# Patient Record
Sex: Female | Born: 1975 | Race: Black or African American | Hispanic: No | Smoking: Never smoker
Health system: Southern US, Community
[De-identification: ages and names within clinical notes are randomized; demographics above are authoritative.]

## PROBLEM LIST (undated history)

## (undated) DIAGNOSIS — K222 Esophageal obstruction: Secondary | ICD-10-CM

## (undated) DIAGNOSIS — E119 Type 2 diabetes mellitus without complications: Secondary | ICD-10-CM

## (undated) DIAGNOSIS — M1990 Unspecified osteoarthritis, unspecified site: Secondary | ICD-10-CM

## (undated) DIAGNOSIS — K219 Gastro-esophageal reflux disease without esophagitis: Secondary | ICD-10-CM

## (undated) DIAGNOSIS — I10 Essential (primary) hypertension: Secondary | ICD-10-CM

## (undated) DIAGNOSIS — N879 Dysplasia of cervix uteri, unspecified: Secondary | ICD-10-CM

## (undated) DIAGNOSIS — J45909 Unspecified asthma, uncomplicated: Secondary | ICD-10-CM

## (undated) HISTORY — DX: Unspecified osteoarthritis, unspecified site: M19.90

## (undated) HISTORY — PX: REDUCTION MAMMAPLASTY: SUR839

## (undated) HISTORY — PX: ESOPHAGEAL DILATION: SHX303

## (undated) HISTORY — DX: Dysplasia of cervix uteri, unspecified: N87.9

## (undated) HISTORY — DX: Gastro-esophageal reflux disease without esophagitis: K21.9

## (undated) HISTORY — DX: Type 2 diabetes mellitus without complications: E11.9

## (undated) HISTORY — DX: Essential (primary) hypertension: I10

## (undated) HISTORY — DX: Esophageal obstruction: K22.2

---

## 1998-02-16 HISTORY — PX: BREAST REDUCTION SURGERY: SHX8

## 2002-02-16 HISTORY — PX: LEEP: SHX91

## 2005-02-16 HISTORY — PX: TUBAL LIGATION: SHX77

## 2016-09-17 DIAGNOSIS — K222 Esophageal obstruction: Secondary | ICD-10-CM | POA: Insufficient documentation

## 2016-11-05 DIAGNOSIS — K200 Eosinophilic esophagitis: Secondary | ICD-10-CM | POA: Insufficient documentation

## 2020-04-02 ENCOUNTER — Ambulatory Visit
Admission: EM | Admit: 2020-04-02 | Discharge: 2020-04-02 | Disposition: A | Discharge status: Home / Self Care | Payer: BC Managed Care – PPO | Attending: Family Medicine | Admitting: Family Medicine

## 2020-04-02 ENCOUNTER — Encounter: Payer: Self-pay | Admitting: Emergency Medicine

## 2020-04-02 ENCOUNTER — Other Ambulatory Visit: Payer: Self-pay

## 2020-04-02 DIAGNOSIS — M545 Low back pain, unspecified: Secondary | ICD-10-CM | POA: Diagnosis present

## 2020-04-02 DIAGNOSIS — Z1152 Encounter for screening for COVID-19: Secondary | ICD-10-CM | POA: Diagnosis present

## 2020-04-02 DIAGNOSIS — R509 Fever, unspecified: Secondary | ICD-10-CM | POA: Insufficient documentation

## 2020-04-02 HISTORY — DX: Unspecified asthma, uncomplicated: J45.909

## 2020-04-02 LAB — POCT URINALYSIS DIP (MANUAL ENTRY)
Bilirubin, UA: NEGATIVE
Glucose, UA: NEGATIVE mg/dL
Nitrite, UA: NEGATIVE
Protein Ur, POC: NEGATIVE mg/dL
Spec Grav, UA: 1.015 (ref 1.010–1.025)
Urobilinogen, UA: 0.2 E.U./dL
pH, UA: 5.5 (ref 5.0–8.0)

## 2020-04-02 MED ORDER — CEFTRIAXONE SODIUM 500 MG IJ SOLR
500.0000 mg | Freq: Once | INTRAMUSCULAR | Status: AC
  Administered 2020-04-02: 500 mg via INTRAMUSCULAR

## 2020-04-02 MED ORDER — ACETAMINOPHEN 325 MG PO TABS
975.0000 mg | ORAL_TABLET | Freq: Once | ORAL | Status: AC
  Administered 2020-04-02: 975 mg via ORAL

## 2020-04-02 MED ORDER — SULFAMETHOXAZOLE-TRIMETHOPRIM 800-160 MG PO TABS: 1.0000 | ORAL_TABLET | Freq: Two times a day (BID) | ORAL | 0 refills | Status: DC

## 2020-04-02 MED ORDER — ONDANSETRON 4 MG PO TBDP: 4.0000 mg | ORAL_TABLET | Freq: Three times a day (TID) | ORAL | 0 refills | Status: DC | PRN

## 2020-04-02 NOTE — Discharge Instructions (Addendum)
I believe that you may have a kidney infection I have given you some antibiotics here Sending more to the pharmacy.  You can take tylenol for pain and fever.  Zofran for nausea as needed.  Push fluids.  Covid an flu test pending.  Follow up as needed for continued or worsening symptoms

## 2020-04-02 NOTE — ED Triage Notes (Signed)
Pt presents today with fever and body aches x 2 days. No meds pta.

## 2020-04-02 NOTE — ED Provider Notes (Signed)
Kellie Cowan    CSN: [id number] Arrival date & time: 04/02/20  1111      History   Chief Complaint Chief Complaint  Patient presents with  . Fever  . Generalized Body Aches    HPI Kellie Cowan is a 45 y.o. female.   Pt is a 45 year old female that presents with lower/mid back pain, fever, body aches. This started 2 days ago and worsened. Fever 103.1 here today. No antipyretics today. No cough, chest congestion, nasal congestion, rhinorrhea, sore throat, ear pain, vomiting or diarrhea. Mild nausea. No urinary symptoms. No recent sick contacts.      Past Medical History:  Diagnosis Date  . Asthma     There are no problems to display for this patient.   History reviewed. No pertinent surgical history.  OB History   No obstetric history on file.      Home Medications    Prior to Admission medications   Medication Sig Start Date End Date Taking? Authorizing Provider  ondansetron (ZOFRAN ODT) 4 MG disintegrating tablet Take 1 tablet (4 mg total) by mouth every 8 (eight) hours as needed for nausea or vomiting. 04/02/20  Yes Timouthy Gilardi A, NP  sulfamethoxazole-trimethoprim (BACTRIM DS) 800-160 MG tablet Take 1 tablet by mouth 2 (two) times daily for 7 days. 04/02/20 04/09/20 Yes Terin Cragle A, NP  betamethasone valerate (VALISONE) 0.1 % cream Apply topically. 03/25/20   [provider]    Family History History reviewed. No pertinent family history.  Social History Social History   Tobacco Use  . Smoking status: Never Smoker  . Smokeless tobacco: Never Used  Substance Use Topics  . Alcohol use: Not Currently    Comment: occ  . Drug use: Never     Allergies   Patient has no known allergies.   Review of Systems Review of Systems   Physical Exam Triage Vital Signs ED Triage Vitals  Enc Vitals Group     BP 04/02/20 1124 120/73     Pulse Rate 04/02/20 1124 (!) 118     Resp 04/02/20 1124 18     Temp 04/02/20 1124 (!) 103.1 F (39.5 C)      Temp Source 04/02/20 1124 Oral     SpO2 04/02/20 1124 98 %     Weight 04/02/20 1127 160 lb (72.6 kg)     Height 04/02/20 1127 5\' 2"  (1.575 m)     Head Circumference --      Peak Flow --      Pain Score 04/02/20 1126 4     Pain Loc --      Pain Edu? --      Excl. in [place]? --    No data found.  Updated Vital Signs BP 120/73 (BP Location: Left Arm)   Pulse (!) 118   Temp (!) 103.1 F (39.5 C) (Oral)   Resp 18   Ht 5\' 2"  (1.575 m)   Wt 160 lb (72.6 kg)   LMP 03/22/2020   SpO2 98%   BMI 29.26 kg/m   Visual Acuity Right Eye Distance:   Left Eye Distance:   Bilateral Distance:    Right Eye Near:   Left Eye Near:    Bilateral Near:     Physical Exam Vitals and nursing note reviewed.  Constitutional:      General: She is not in acute distress.    Appearance: Normal appearance. She is not ill-appearing, toxic-appearing or diaphoretic.  HENT:  Head: Normocephalic.  Eyes:     Conjunctiva/sclera: Conjunctivae normal.  Pulmonary:     Effort: Pulmonary effort is normal.  Musculoskeletal:        General: Normal range of motion.     Cervical back: Normal range of motion.       Back:     Comments: Area of pain No CVA tenderness   Skin:    General: Skin is warm and dry.     Findings: No rash.  Neurological:     Mental Status: She is alert.  Psychiatric:        Mood and Affect: Mood normal.      UC Treatments / Results  Labs (all labs ordered are listed, but only abnormal results are displayed) Labs Reviewed  POCT URINALYSIS DIP (MANUAL ENTRY) - Abnormal; Notable for the following components:      Result Value   Ketones, POC UA trace (5) (*)    Blood, UA large (*)    Leukocytes, UA Small (1+) (*)    All other components within normal limits  COVID-19, FLU A+B NAA  URINE CULTURE    EKG   Radiology No results found.  Procedures Procedures (including critical care time)  Medications Ordered in UC Medications  acetaminophen (TYLENOL) tablet 975  mg (975 mg Oral Given 04/02/20 1137)  cefTRIAXone (ROCEPHIN) injection 500 mg (500 mg Intramuscular Given 04/02/20 1200)    Initial Impression / Assessment and Plan / UC Course  I have reviewed the triage vital signs and the nursing notes.  Pertinent labs & imaging results that were available during my care of the patient were reviewed by me and considered in my medical decision making (see chart for details).     Back pain, fever.  Most of a diagnosis based on symptoms, fever and urinalysis report is complicated urinary tract infection. No other signs and symptoms to point towards Covid versus flu Urine with small leuks, large blood.  Sending for culture.  We will go ahead and treat for possible urinary tract/kidney infection Treating with Rocephin injection here in clinic.  Bactrim sent to the pharmacy to start taking today.  Recommend push fluids. Zofran for nausea as needed Follow up as needed for continued or worsening symptoms  Final Clinical Impressions(s) / UC Diagnoses   Final diagnoses:  Encounter for screening for COVID-19  Fever, unspecified  Acute low back pain without sciatica, unspecified back pain laterality     Discharge Instructions     I believe that you may have a kidney infection I have given you some antibiotics here Sending more to the pharmacy.  You can take tylenol for pain and fever.  Zofran for nausea as needed.  Push fluids.  Covid an flu test pending.  Follow up as needed for continued or worsening symptoms       ED Prescriptions    Medication Sig Dispense Auth. Provider   sulfamethoxazole-trimethoprim (BACTRIM DS) 800-160 MG tablet Take 1 tablet by mouth 2 (two) times daily for 7 days. 14 tablet Keian Odriscoll A, NP   ondansetron (ZOFRAN ODT) 4 MG disintegrating tablet Take 1 tablet (4 mg total) by mouth every 8 (eight) hours as needed for nausea or vomiting. 20 tablet Loura Halt A, NP     PDMP not reviewed this encounter.   Loura Halt A,  NP 04/02/20 1230

## 2020-04-03 LAB — URINE CULTURE: Culture: 10000 — AB

## 2020-04-03 LAB — COVID-19, FLU A+B NAA
Influenza A, NAA: NOT DETECTED
Influenza B, NAA: NOT DETECTED
SARS-CoV-2, NAA: NOT DETECTED

## 2020-04-04 ENCOUNTER — Encounter: Payer: Self-pay | Admitting: Family Medicine

## 2020-04-04 ENCOUNTER — Ambulatory Visit
Admission: EM | Admit: 2020-04-04 | Discharge: 2020-04-04 | Disposition: A | Discharge status: Home / Self Care | Payer: BC Managed Care – PPO

## 2020-04-04 ENCOUNTER — Other Ambulatory Visit: Payer: Self-pay

## 2020-04-04 DIAGNOSIS — R21 Rash and other nonspecific skin eruption: Secondary | ICD-10-CM

## 2020-04-04 NOTE — ED Provider Notes (Signed)
[name]    CSN: [id number] Arrival date & time: 04/04/20  1432      History   Chief Complaint Chief Complaint  Patient presents with  . Rash    HPI Kellie Cowan is a 45 y.o. female.   Kellie Cowan is a 45 year old female here today for rash.  Patient started bactrim on Tuesday for UTI and developed rash Tuesday evening on right cheek.  Patient reports that rash spread to left side of face on Wednesday and hands began to itch.  Rash has not gotten worse or spread since yesterday.  Patient denies using anything to relieve itching  The history is provided by the patient.  Rash Location:  Face Facial rash location:  L cheek and R cheek Quality: itchiness   Severity:  Mild Onset quality:  Gradual Duration:  2 days Timing:  Constant Progression:  Improving Chronicity:  New Context: medications   Relieved by:  None tried Worsened by:  Nothing Ineffective treatments:  None tried Associated symptoms: no fatigue, no fever, no hoarse voice, no periorbital edema, no shortness of breath, no throat swelling and no tongue swelling     Past Medical History:  Diagnosis Date  . Asthma     There are no problems to display for this patient.   History reviewed. No pertinent surgical history.  OB History   No obstetric history on file.      Home Medications    Prior to Admission medications   Medication Sig Start Date End Date Taking? Authorizing Provider  betamethasone valerate (VALISONE) 0.1 % cream Apply topically. 03/25/20   [provider]  ondansetron (ZOFRAN ODT) 4 MG disintegrating tablet Take 1 tablet (4 mg total) by mouth every 8 (eight) hours as needed for nausea or vomiting. 04/02/20   Orvan July, NP    Family History History reviewed. No pertinent family history.  Social History Social History   Tobacco Use  . Smoking status: Never Smoker  . Smokeless tobacco: Never Used  Substance Use Topics  . Alcohol use: Not Currently     Comment: occ  . Drug use: Never     Allergies   Patient has no known allergies.   Review of Systems Review of Systems  Constitutional: Negative for fatigue and fever.  HENT: Negative for hoarse voice.   Respiratory: Negative for shortness of breath.   Skin: Positive for rash.     Physical Exam Triage Vital Signs ED Triage Vitals  Enc Vitals Group     BP 04/04/20 1441 128/84     Pulse Rate 04/04/20 1441 90     Resp 04/04/20 1441 18     Temp 04/04/20 1441 98.8 F (37.1 C)     Temp Source 04/04/20 1441 Oral     SpO2 04/04/20 1441 98 %     Weight --      Height --      Head Circumference --      Peak Flow --      Pain Score 04/04/20 1445 0     Pain Loc --      Pain Edu? --      Excl. in [place]? --    No data found.  Updated Vital Signs BP 128/84 (BP Location: Left Arm)   Pulse 90   Temp 98.8 F (37.1 C) (Oral)   Resp 18   LMP 03/22/2020   SpO2 98%   Visual Acuity Right Eye Distance:   Left Eye Distance:  Bilateral Distance:    Right Eye Near:   Left Eye Near:    Bilateral Near:     Physical Exam Vitals and nursing note reviewed.  Constitutional:      General: She is not in acute distress.    Appearance: She is not ill-appearing or diaphoretic.  HENT:     Head: Normocephalic and atraumatic.  Pulmonary:     Effort: Pulmonary effort is normal.  Skin:    General: Skin is warm and dry.     Findings: Rash present. Rash is papular.  Neurological:     Mental Status: She is alert.      UC Treatments / Results  Labs (all labs ordered are listed, but only abnormal results are displayed) Labs Reviewed - No data to display  EKG   Radiology No results found.  Procedures Procedures (including critical care time)  Medications Ordered in UC Medications - No data to display  Initial Impression / Assessment and Plan / UC Course  I have reviewed the triage vital signs and the nursing notes.  Pertinent labs & imaging results that were available  during my care of the patient were reviewed by me and considered in my medical decision making (see chart for details).     Rash, likely related to antibiotic use.  Assessment negative for any additional red flags.   Bactrim discontinued. May take benadryl for itching.  Instructed to monitor rash for spreading and told to follow up as needed.   Final Clinical Impressions(s) / UC Diagnoses   Final diagnoses:  Rash and nonspecific skin eruption     Discharge Instructions     Stop taking bactrim.  Rash is most likely related to bactrim use.  You may use benadryl for itching.    Follow up if rash worsens, spreads, or does not improve over the next few days.     ED Prescriptions    None     PDMP not reviewed this encounter.   Pearson Forster, NP 04/04/20 [phone]

## 2020-04-04 NOTE — ED Triage Notes (Signed)
Pt presents today with rash to face and hands. She does report the rash began after taking first dose of antibiotic.

## 2020-04-04 NOTE — Discharge Instructions (Addendum)
Stop taking bactrim.  Rash is most likely related to bactrim use.  You may use benadryl for itching.    Follow up if rash worsens, spreads, or does not improve over the next few days.

## 2021-05-15 ENCOUNTER — Other Ambulatory Visit (HOSPITAL_COMMUNITY)
Admission: RE | Admit: 2021-05-15 | Discharge: 2021-05-15 | Disposition: A | Discharge status: Home / Self Care | Payer: No Typology Code available for payment source | Source: Ambulatory Visit | Attending: Obstetrics & Gynecology | Admitting: Obstetrics & Gynecology

## 2021-05-15 ENCOUNTER — Encounter: Payer: Self-pay | Admitting: Obstetrics & Gynecology

## 2021-05-15 ENCOUNTER — Ambulatory Visit (INDEPENDENT_AMBULATORY_CARE_PROVIDER_SITE_OTHER): Payer: No Typology Code available for payment source | Admitting: Obstetrics & Gynecology

## 2021-05-15 VITALS — BP 143/80 | HR 82 | Wt 162.0 lb

## 2021-05-15 DIAGNOSIS — N920 Excessive and frequent menstruation with regular cycle: Secondary | ICD-10-CM

## 2021-05-15 DIAGNOSIS — Z1211 Encounter for screening for malignant neoplasm of colon: Secondary | ICD-10-CM

## 2021-05-15 DIAGNOSIS — F5089 Other specified eating disorder: Secondary | ICD-10-CM | POA: Diagnosis not present

## 2021-05-15 DIAGNOSIS — Z01419 Encounter for gynecological examination (general) (routine) without abnormal findings: Secondary | ICD-10-CM

## 2021-05-15 DIAGNOSIS — D500 Iron deficiency anemia secondary to blood loss (chronic): Secondary | ICD-10-CM | POA: Diagnosis not present

## 2021-05-15 DIAGNOSIS — Z1231 Encounter for screening mammogram for malignant neoplasm of breast: Secondary | ICD-10-CM

## 2021-05-15 MED ORDER — NAPROXEN SODIUM 550 MG PO TABS: 550.0000 mg | ORAL_TABLET | Freq: Two times a day (BID) | ORAL | 2 refills | Status: DC

## 2021-05-15 MED ORDER — TRANEXAMIC ACID 650 MG PO TABS: 1300.0000 mg | ORAL_TABLET | Freq: Three times a day (TID) | ORAL | 2 refills | Status: DC

## 2021-05-15 NOTE — Progress Notes (Signed)
? ? ?GYNECOLOGY ANNUAL PREVENTATIVE CARE ENCOUNTER NOTE ? ?History:    ? Kellie Cowan is a 46 y.o. [id number] female here for a routine annual gynecologic exam and to establish care.  Current complaints: heavy menstrual bleeding.  Had regular menses until one year ago, has very heavy bleeding the first two days that messes up her clothes even while wearing menstrual cups but overnight pads, changes every 30 minutes. Bled through clothes onto chair. Associated with cramping, alleviated a little with Ibuprofen. Also with headaches and nausea, also has Pica and has been eating ice for most of the last year.  Periods last 5-6 days, regular periods. Rest of 3-4 days of period not concerning.  Denies abnormal vaginal bleeding, discharge, pelvic pain, problems with intercourse or other gynecologic concerns.  ?  ?Gynecologic History ?Patient's last menstrual period was 04/23/2021 (exact date). ?Contraception: abstinence ?Last Pap: 2017. Result was normal with negative HPV ?Last Mammogram: 2017.  Result was normal ? ?Obstetric History ?OB History  ?Gravida Para Term Preterm AB Living  ?'3 3 3     3  '$ ?SAB IAB Ectopic Multiple Live Births  ?        3  ?  ?# Outcome Date GA Lbr Len/2nd Weight Sex Delivery Anes PTL Lv  ?3 Term      Vag-Spont     ?2 Term      Vag-Spont     ?1 Term      Vag-Spont     ? ? ?Past Medical History:  ?Diagnosis Date  ? Asthma   ? Cervical dysplasia   ? s/p LEEP in 2004  ? Esophageal stricture   ? s/p dilation x 2  ? ? ?Past Surgical History:  ?Procedure Laterality Date  ? BREAST REDUCTION SURGERY  2000  ? ESOPHAGEAL DILATION    ? X2 for stricture  ? LEEP  2004  ? TUBAL LIGATION  2007  ? ? ?Current Outpatient Medications on File Prior to Visit  ?Medication Sig Dispense Refill  ? betamethasone valerate (VALISONE) 0.1 % cream Apply topically. (Patient not taking: Reported on 05/15/2021)    ? ondansetron (ZOFRAN ODT) 4 MG disintegrating tablet Take 1 tablet (4 mg total) by mouth every 8 (eight) hours as  needed for nausea or vomiting. (Patient not taking: Reported on 05/15/2021) 20 tablet 0  ? ?No current facility-administered medications on file prior to visit.  ? ? ?No Known Allergies ? ?Social History:  reports that she has never smoked. She has never used smokeless tobacco. She reports that she does not currently use alcohol. She reports that she does not use drugs. ? ?History reviewed. No pertinent family history. ? ?The following portions of the patient's history were reviewed and updated as appropriate: allergies, current medications, past family history, past medical history, past social history, past surgical history and problem list. ? ?Review of Systems ?Pertinent items noted in HPI and remainder of comprehensive ROS otherwise negative. ? ?Physical Exam:  ?BP (!) 143/80   Pulse 82   Wt 162 lb (73.5 kg)   LMP 04/23/2021 (Exact Date)   BMI 29.63 kg/m?  ?CONSTITUTIONAL: Well-developed, well-nourished female in no acute distress.  ?HENT:  Normocephalic, atraumatic, External right and left ear normal.  ?EYES: Conjunctivae and EOM are normal. Pupils are equal, round, and reactive to light. No scleral icterus.  ?NECK: Normal range of motion, supple, no masses.  Normal thyroid.  ?SKIN: Skin is warm and dry. No rash noted. Not diaphoretic. No erythema. No pallor. ?MUSCULOSKELETAL:  Normal range of motion. No tenderness.  No cyanosis, clubbing, or edema. ?NEUROLOGIC: Alert and oriented to person, place, and time. Normal reflexes, muscle tone coordination.  ?PSYCHIATRIC: Normal mood and affect. Normal behavior. Normal judgment and thought content. ?CARDIOVASCULAR: Normal heart rate noted, regular rhythm ?RESPIRATORY: Clear to auscultation bilaterally. Effort and breath sounds normal, no problems with respiration noted. ?BREASTS: Symmetric in size. No masses, tenderness, skin changes, nipple drainage, or lymphadenopathy bilaterally. Performed in the presence of a chaperone. ?ABDOMEN: Soft, no distention noted.  No  tenderness, rebound or guarding.  ?PELVIC: Normal appearing external genitalia and urethral meatus; normal appearing vaginal mucosa and cervix.  No abnormal vaginal discharge noted.  Pap smear obtained.  Enlarged uterus about 18-19 week size and globular.  No other palpable masses, no uterine or adnexal tenderness.  Performed in the presence of a chaperone. ?  ?Assessment and Plan:  ?  1. Menorrhagia with regular cycle ?Patient has abnormal uterine bleeding . Enlarged uterus on exam, likely has fibroids.  Will order abnormal uterine bleeding evaluation labs and pelvic ultrasound to evaluate for any structural gynecologic abnormalities.  Will contact patient with these results.  In the meantime, discussed management options for abnormal uterine bleeding including NSAIDs (Naproxen), tranexamic acid (Lysteda), oral progesterone, Depo Provera, Levonogestrel IUD, endometrial ablation or hysterectomy as definitive surgical management.  Patient desires nonhormonal intervention for now.   Lysteda and Naproxen prescribed as needed for now,  bleeding precautions reviewed.  ?- naproxen sodium (ANAPROX DS) 550 MG tablet; Take 1 tablet (550 mg total) by mouth 2 (two) times daily with a meal. Start taking one day before period and take throughout period, or as needed for pain  Dispense: 30 tablet; Refill: 2 ?- tranexamic acid (LYSTEDA) 650 MG TABS tablet; Take 2 tablets (1,300 mg total) by mouth 3 (three) times daily. Take during menses for a maximum of five days  Dispense: 30 tablet; Refill: 2 ?- CBC ?- TSH ?- US PELVIC COMPLETE WITH TRANSVAGINAL; Future ? ?2. Pica ?Likely anemic, will check labs and manage accordingly. ?- CBC ?- Iron, TIBC and Ferritin Panel ? ?I spent 25 minutes dedicated to the care of this patient including pre-visit review of records, face to face time with the patient discussing her menorrhagia, Pica and treatments and post visit ordering of testing. ? ?3. Breast cancer screening by mammogram ?Mammogram  scheduled ?- MM 3D SCREEN BREAST BILATERAL; Future ? ?4. Well woman exam with routine gynecological exam ?- Cytology - PAP ?Will follow up results of pap smear and manage accordingly. ?Discussed need for colon cancer screening over the age 65. Colonoscopy recommended as this is the gold standard. Patient will decide and let us know her decision. ?Routine preventative health maintenance measures emphasized. ?Please refer to After Visit Summary for other counseling recommendations.  ?   ? ?Verita Schneiders, MD, FACOG ?Obstetrician Social research officer, government, Faculty Practice ?[facility] [place] [facility] ? ?

## 2021-05-16 LAB — CBC
Hematocrit: 32.8 % — ABNORMAL LOW (ref 34.0–46.6)
Hemoglobin: 9.5 g/dL — ABNORMAL LOW (ref 11.1–15.9)
MCH: 20 pg — ABNORMAL LOW (ref 26.6–33.0)
MCHC: 29 g/dL — ABNORMAL LOW (ref 31.5–35.7)
MCV: 69 fL — ABNORMAL LOW (ref 79–97)
Platelets: 480 10*3/uL — ABNORMAL HIGH (ref 150–450)
RBC: 4.76 x10E6/uL (ref 3.77–5.28)
RDW: 24.6 % — ABNORMAL HIGH (ref 11.7–15.4)
WBC: 6.1 10*3/uL (ref 3.4–10.8)

## 2021-05-16 LAB — IRON,TIBC AND FERRITIN PANEL
Ferritin: 11 ng/mL — ABNORMAL LOW (ref 15–150)
Iron Saturation: 48 % (ref 15–55)
Iron: 229 ug/dL — ABNORMAL HIGH (ref 27–159)
Total Iron Binding Capacity: 476 ug/dL — ABNORMAL HIGH (ref 250–450)
UIBC: 247 ug/dL (ref 131–425)

## 2021-05-16 LAB — TSH: TSH: 2.22 u[IU]/mL (ref 0.450–4.500)

## 2021-05-19 ENCOUNTER — Other Ambulatory Visit: Payer: Self-pay

## 2021-05-19 ENCOUNTER — Other Ambulatory Visit: Payer: Self-pay | Admitting: Obstetrics & Gynecology

## 2021-05-19 DIAGNOSIS — Z1211 Encounter for screening for malignant neoplasm of colon: Secondary | ICD-10-CM

## 2021-05-19 DIAGNOSIS — D500 Iron deficiency anemia secondary to blood loss (chronic): Secondary | ICD-10-CM | POA: Insufficient documentation

## 2021-05-19 HISTORY — DX: Iron deficiency anemia secondary to blood loss (chronic): D50.0

## 2021-05-19 MED ORDER — FERROUS GLUCONATE 324 (38 FE) MG PO TABS: 324.0000 mg | ORAL_TABLET | Freq: Every day | ORAL | 3 refills | Status: DC

## 2021-05-19 MED ORDER — CLENPIQ 10-3.5-12 MG-GM -GM/160ML PO SOLN: 1.0000 | ORAL | 0 refills | Status: DC

## 2021-05-19 NOTE — Progress Notes (Signed)
Gastroenterology Pre-Procedure Review ? ?Request Date: 05/19/20223 ?Requesting Physician: Dr. Vicente Males ? ?PATIENT REVIEW QUESTIONS: The patient responded to the following health history questions as indicated:   ? ?1. Are you having any GI issues? no ?2. Do you have a personal history of Polyps? no ?3. Do you have a family history of Colon Cancer or Polyps? no ?4. Diabetes Mellitus? no ?5. Joint replacements in the past 12 months?no ?6. Major health problems in the past 3 months?no ?7. Any artificial heart valves, MVP, or defibrillator?no ?   ?MEDICATIONS & ALLERGIES:    ?Patient reports the following regarding taking any anticoagulation/antiplatelet therapy:   ?Plavix, Coumadin, Eliquis, Xarelto, Lovenox, Pradaxa, Brilinta, or Effient? no ?Aspirin? no ? ?Patient confirms/reports the following medications:  ?Current Outpatient Medications  ?Medication Sig Dispense Refill  ? ferrous gluconate (FERGON) 324 MG tablet Take 1 tablet (324 mg total) by mouth daily with breakfast. 30 tablet 3  ? naproxen sodium (ANAPROX DS) 550 MG tablet Take 1 tablet (550 mg total) by mouth 2 (two) times daily with a meal. Start taking one day before period and take throughout period, or as needed for pain 30 tablet 2  ? tranexamic acid (LYSTEDA) 650 MG TABS tablet Take 2 tablets (1,300 mg total) by mouth 3 (three) times daily. Take during menses for a maximum of five days 30 tablet 2  ? ?No current facility-administered medications for this visit.  ? ? ?Patient confirms/reports the following allergies:  ?No Known Allergies ? ?No orders of the defined types were placed in this encounter. ? ? ?AUTHORIZATION INFORMATION ?Primary Insurance: ?1D#: ?Group #: ? ?Secondary Insurance: ?1D#: ?Group #: ? ?SCHEDULE INFORMATION: ?Date: 07/04/2021 ?Time: ?Location:[facility] ? ?

## 2021-05-19 NOTE — Addendum Note (Signed)
Addended by: Verita Schneiders A on: 05/19/2021 09:23 AM ? ? Modules accepted: Orders ? ?

## 2021-05-19 NOTE — Progress Notes (Signed)
Iron infusion ordered for patient with iron deficiency anemia due to chronic blood loss. ?Office staff will contact her with further details. ? ?Verita Schneiders, MD ? ?

## 2021-05-20 LAB — CYTOLOGY - PAP
Comment: NEGATIVE
Diagnosis: NEGATIVE
Diagnosis: REACTIVE
High risk HPV: NEGATIVE

## 2021-05-28 ENCOUNTER — Ambulatory Visit
Admission: RE | Admit: 2021-05-28 | Discharge: 2021-05-28 | Disposition: A | Discharge status: Home / Self Care | Payer: No Typology Code available for payment source | Source: Ambulatory Visit | Attending: Obstetrics & Gynecology | Admitting: Obstetrics & Gynecology

## 2021-05-28 ENCOUNTER — Other Ambulatory Visit: Payer: Self-pay | Admitting: Obstetrics & Gynecology

## 2021-05-28 DIAGNOSIS — N920 Excessive and frequent menstruation with regular cycle: Secondary | ICD-10-CM | POA: Diagnosis present

## 2021-05-29 ENCOUNTER — Telehealth: Payer: Self-pay

## 2021-05-29 NOTE — Telephone Encounter (Signed)
TC to pt regarding results of U/S and providers recommendations ?Pt not ava LVM for pt to return call to the office.  ? ?

## 2021-05-29 NOTE — Telephone Encounter (Signed)
-----   Message from Osborne Oman, MD sent at 05/29/2021 12:10 PM EDT ----- ?Patient has large fibroids.  If bleeding is controlled on medication, no urgent surgical intervention needed as fibroids are usually benign.  However, if bleeding is not controlled, she will need to come in to discuss surgical management and also endometrial biopsy will be recommended.  Please call to inform patient of results and recommendations. ? ?

## 2021-06-04 ENCOUNTER — Non-Acute Institutional Stay (HOSPITAL_COMMUNITY)
Admission: RE | Admit: 2021-06-04 | Discharge: 2021-06-04 | Disposition: A | Discharge status: Home / Self Care | Payer: No Typology Code available for payment source | Source: Ambulatory Visit | Attending: Internal Medicine | Admitting: Internal Medicine

## 2021-06-04 DIAGNOSIS — D500 Iron deficiency anemia secondary to blood loss (chronic): Secondary | ICD-10-CM | POA: Insufficient documentation

## 2021-06-04 MED ORDER — SODIUM CHLORIDE 0.9 % IV SOLN
510.0000 mg | INTRAVENOUS | Status: DC
  Administered 2021-06-04: 510 mg via INTRAVENOUS
  Filled 2021-06-04: qty 17

## 2021-06-04 MED ORDER — EPINEPHRINE PF 1 MG/ML IJ SOLN
0.3000 mg | Freq: Once | INTRAMUSCULAR | Status: DC | PRN
  Filled 2021-06-04: qty 1

## 2021-06-04 MED ORDER — SODIUM CHLORIDE 0.9 % IV SOLN: INTRAVENOUS | Status: DC | PRN

## 2021-06-04 MED ORDER — SODIUM CHLORIDE 0.9 % IV BOLUS: 500.0000 mL | Freq: Once | INTRAVENOUS | Status: DC | PRN

## 2021-06-04 MED ORDER — METHYLPREDNISOLONE SODIUM SUCC 125 MG IJ SOLR
125.0000 mg | Freq: Once | INTRAMUSCULAR | Status: AC | PRN
  Administered 2021-06-04: 125 mg via INTRAVENOUS
  Filled 2021-06-04: qty 2

## 2021-06-04 MED ORDER — DIPHENHYDRAMINE HCL 50 MG/ML IJ SOLN
25.0000 mg | Freq: Once | INTRAMUSCULAR | Status: AC | PRN
  Administered 2021-06-04: 25 mg via INTRAVENOUS
  Filled 2021-06-04: qty 1

## 2021-06-04 MED ORDER — ALBUTEROL SULFATE (2.5 MG/3ML) 0.083% IN NEBU: 2.5000 mg | INHALATION_SOLUTION | Freq: Once | RESPIRATORY_TRACT | Status: DC | PRN

## 2021-06-04 NOTE — Progress Notes (Signed)
PATIENT CARE CENTER: ? ?Diagnosis: iron deficiency anemia due to chronic blood loss ? ? ?Provider: Verita Schneiders, MD ? ? ?Procedure: Feraheme '510mg'$  ? ?Patient received IV Feraheme ( dose 1 of 2) . 30 minutes into 1 hour infusion, pt reported a tingling sensation in her throat, coughing, and lightheadedness. Infusion stopped, VS taken and stable, pt given PRN IV Benadryl '25mg'$ . Pt reports tingling in throat is still present but is not worsening, however still feels lightheaded. Solumedrol '125mg'$  IV given x 1. VSS. Pt observed, and reported symptoms completely resolved, other than feeling drowsy from the benadryl, no new symptoms reported. Dr. Rema Jasmine clinical staff, Crosby Oyster RN,  contacted via epic secure chat, informed of pt's reaction to the drug. Per RN instructed not to administer remainder of drug and discharge pt. Dr. Loni Muse to be notified about possibly changing drug to Venofer, and provider will contact patient. Patient made aware of plan, verbalized understanding. Patient observed for 2 hours after reaction, VSS, and pt voices no further complaints. Patient alert, oriented, and ambulatory at the time of discharge.   ?

## 2021-06-05 ENCOUNTER — Other Ambulatory Visit: Payer: Self-pay | Admitting: Obstetrics & Gynecology

## 2021-06-05 DIAGNOSIS — D500 Iron deficiency anemia secondary to blood loss (chronic): Secondary | ICD-10-CM

## 2021-06-05 NOTE — Progress Notes (Signed)
Patient had reaction to Feraheme, Venofer ordered instead. ?This will be scheduled for patient. ? ?Verita Schneiders, MD ? ?

## 2021-06-06 ENCOUNTER — Encounter: Payer: Self-pay | Admitting: Obstetrics & Gynecology

## 2021-06-06 ENCOUNTER — Telehealth: Payer: Self-pay

## 2021-06-06 NOTE — Telephone Encounter (Signed)
TC to pt to make aware of IV infusion appt for 06/20/21 at 8:00 am ?Pt was in process of sending mychart message to Dr.A regarding appt.  ?Pt advised to continue message and I will be sure to direct concerns to provider and if appt needs to be changed or Rescheduled pt will be notified.  ? ?

## 2021-06-20 ENCOUNTER — Encounter (HOSPITAL_COMMUNITY)
Admission: RE | Admit: 2021-06-20 | Discharge: 2021-06-20 | Disposition: A | Discharge status: Home / Self Care | Payer: No Typology Code available for payment source | Source: Ambulatory Visit | Attending: Obstetrics & Gynecology | Admitting: Obstetrics & Gynecology

## 2021-06-20 DIAGNOSIS — D500 Iron deficiency anemia secondary to blood loss (chronic): Secondary | ICD-10-CM | POA: Diagnosis present

## 2021-06-20 MED ORDER — SODIUM CHLORIDE 0.9 % IV SOLN
300.0000 mg | INTRAVENOUS | Status: DC
  Administered 2021-06-20: 300 mg via INTRAVENOUS
  Filled 2021-06-20: qty 300

## 2021-06-25 ENCOUNTER — Ambulatory Visit
Admission: RE | Admit: 2021-06-25 | Discharge: 2021-06-25 | Disposition: A | Discharge status: Home / Self Care | Payer: No Typology Code available for payment source | Source: Ambulatory Visit | Attending: Obstetrics & Gynecology | Admitting: Obstetrics & Gynecology

## 2021-06-25 DIAGNOSIS — Z1231 Encounter for screening mammogram for malignant neoplasm of breast: Secondary | ICD-10-CM | POA: Insufficient documentation

## 2021-06-26 ENCOUNTER — Inpatient Hospital Stay
Admission: RE | Admit: 2021-06-26 | Discharge: 2021-06-26 | Disposition: A | Discharge status: Home / Self Care | Payer: Self-pay | Source: Ambulatory Visit | Attending: *Deleted | Admitting: *Deleted

## 2021-06-26 ENCOUNTER — Encounter (HOSPITAL_COMMUNITY)
Admission: RE | Admit: 2021-06-26 | Discharge: 2021-06-26 | Disposition: A | Discharge status: Home / Self Care | Payer: No Typology Code available for payment source | Source: Ambulatory Visit | Attending: Obstetrics & Gynecology | Admitting: Obstetrics & Gynecology

## 2021-06-26 ENCOUNTER — Other Ambulatory Visit: Payer: Self-pay | Admitting: *Deleted

## 2021-06-26 DIAGNOSIS — Z1231 Encounter for screening mammogram for malignant neoplasm of breast: Secondary | ICD-10-CM

## 2021-06-26 DIAGNOSIS — D500 Iron deficiency anemia secondary to blood loss (chronic): Secondary | ICD-10-CM | POA: Diagnosis not present

## 2021-06-26 MED ORDER — SODIUM CHLORIDE 0.9 % IV SOLN
300.0000 mg | INTRAVENOUS | Status: DC
  Administered 2021-06-26: 300 mg via INTRAVENOUS
  Filled 2021-06-26: qty 300
  Filled 2021-06-26: qty 15

## 2021-07-03 ENCOUNTER — Encounter (HOSPITAL_COMMUNITY): Payer: No Typology Code available for payment source

## 2021-07-04 ENCOUNTER — Encounter (HOSPITAL_COMMUNITY)
Admission: RE | Admit: 2021-07-04 | Discharge: 2021-07-04 | Disposition: A | Discharge status: Home / Self Care | Payer: No Typology Code available for payment source | Source: Ambulatory Visit | Attending: Obstetrics & Gynecology | Admitting: Obstetrics & Gynecology

## 2021-07-04 DIAGNOSIS — D500 Iron deficiency anemia secondary to blood loss (chronic): Secondary | ICD-10-CM

## 2021-07-04 MED ORDER — DIPHENHYDRAMINE HCL 50 MG/ML IJ SOLN: 25.0000 mg | Freq: Once | INTRAMUSCULAR | Status: DC | PRN

## 2021-07-04 MED ORDER — METHYLPREDNISOLONE SODIUM SUCC 125 MG IJ SOLR: 125.0000 mg | Freq: Once | INTRAMUSCULAR | Status: DC | PRN

## 2021-07-04 MED ORDER — SODIUM CHLORIDE 0.9 % IV SOLN
300.0000 mg | INTRAVENOUS | Status: DC
  Administered 2021-07-04: 300 mg via INTRAVENOUS
  Filled 2021-07-04: qty 300

## 2021-07-04 MED ORDER — EPINEPHRINE PF 1 MG/ML IJ SOLN: 0.3000 mg | Freq: Once | INTRAMUSCULAR | Status: DC | PRN

## 2021-07-04 MED ORDER — SODIUM CHLORIDE 0.9 % IV BOLUS: 500.0000 mL | Freq: Once | INTRAVENOUS | Status: DC | PRN

## 2021-07-04 MED ORDER — SODIUM CHLORIDE 0.9 % IV SOLN: INTRAVENOUS | Status: DC | PRN

## 2021-07-04 MED ORDER — ALBUTEROL SULFATE (2.5 MG/3ML) 0.083% IN NEBU: 2.5000 mg | INHALATION_SOLUTION | Freq: Once | RESPIRATORY_TRACT | Status: DC | PRN

## 2021-09-05 ENCOUNTER — Encounter: Admission: RE | Payer: Self-pay | Source: Home / Self Care

## 2021-09-05 ENCOUNTER — Ambulatory Visit
Admission: RE | Admit: 2021-09-05 | Payer: No Typology Code available for payment source | Source: Home / Self Care | Admitting: Gastroenterology

## 2021-09-05 SURGERY — COLONOSCOPY WITH PROPOFOL
Anesthesia: General

## 2022-02-02 ENCOUNTER — Encounter: Payer: Self-pay | Admitting: *Deleted

## 2022-02-13 ENCOUNTER — Encounter: Payer: Self-pay | Admitting: *Deleted

## 2022-02-17 ENCOUNTER — Encounter: Payer: Self-pay | Admitting: Obstetrics & Gynecology

## 2022-02-17 ENCOUNTER — Ambulatory Visit: Payer: No Typology Code available for payment source | Admitting: Obstetrics & Gynecology

## 2022-02-17 VITALS — BP 153/79 | HR 85 | Ht 62.5 in | Wt 168.0 lb

## 2022-02-17 DIAGNOSIS — D500 Iron deficiency anemia secondary to blood loss (chronic): Secondary | ICD-10-CM | POA: Diagnosis not present

## 2022-02-17 DIAGNOSIS — D219 Benign neoplasm of connective and other soft tissue, unspecified: Secondary | ICD-10-CM | POA: Diagnosis not present

## 2022-02-17 DIAGNOSIS — N939 Abnormal uterine and vaginal bleeding, unspecified: Secondary | ICD-10-CM

## 2022-02-17 HISTORY — DX: Abnormal uterine and vaginal bleeding, unspecified: N93.9

## 2022-02-17 MED ORDER — MYFEMBREE 40-1-0.5 MG PO TABS: 1.0000 | ORAL_TABLET | Freq: Every day | ORAL | 5 refills | Status: DC

## 2022-02-17 MED ORDER — MEGESTROL ACETATE 40 MG PO TABS: 40.0000 mg | ORAL_TABLET | Freq: Two times a day (BID) | ORAL | 5 refills | Status: DC

## 2022-02-17 NOTE — Progress Notes (Unsigned)
GYNECOLOGY OFFICE VISIT NOTE  History:   Kellie Cowan is a 47 y.o. [phone] here today for discussion about management of bleeding due to her fibroids.  Has know large fibroids, not enthusiastic about surgical management.  Wants to discuss medical management.  Bleeds most days of the month, longest bleeding free interval is 7 days.   She denies any current abnormal vaginal discharge, bleeding, pelvic pain, presyncopal symptoms or other concerns.    Past Medical History:  Diagnosis Date   Asthma    Cervical dysplasia    s/p LEEP in 2004   Esophageal stricture    s/p dilation x 2    Past Surgical History:  Procedure Laterality Date   BREAST REDUCTION SURGERY  2000   BREAST REDUCTION SURGERY Bilateral 2000   ESOPHAGEAL DILATION     X2 for stricture   LEEP  2004   TUBAL LIGATION  2007    The following portions of the patient's history were reviewed and updated as appropriate: allergies, current medications, past family history, past medical history, past social history, past surgical history and problem list.   Health Maintenance:  Normal pap and negative HRHPV on 05/05/21.  Normal mammogram on 06/26/21.   Review of Systems:  Pertinent items noted in HPI and remainder of comprehensive ROS otherwise negative.  Physical Exam:  BP (!) 153/79   Pulse 85   Ht 5' 2.5" (1.588 m)   Wt 168 lb (76.2 kg)   BMI 30.24 kg/m  CONSTITUTIONAL: Well-developed, well-nourished female in no acute distress.  HEENT:  Normocephalic, atraumatic. External right and left ear normal. No scleral icterus.  NECK: Normal range of motion, supple, no masses noted on observation SKIN: No rash noted. Not diaphoretic. No erythema. No pallor. MUSCULOSKELETAL: Normal range of motion. No edema noted. NEUROLOGIC: Alert and oriented to person, place, and time. Normal muscle tone coordination. No cranial nerve deficit noted. PSYCHIATRIC: Normal mood and affect. Normal behavior. Normal judgment and thought  content. CARDIOVASCULAR: Normal heart rate noted RESPIRATORY: Effort and breath sounds normal, no problems with respiration noted ABDOMEN: Large fibroid uterus. No other overt distention noted.   PELVIC: Deferred  412/2023 TRANSABDOMINAL ULTRASOUND OF PELVIS  CLINICAL DATA:  Initial evaluation for menorrhagia.  Transabdominal ultrasound examination of the pelvis was performed including evaluation of the uterus, ovaries, adnexal regions, and pelvic cul-de-sac. Please note that patient declined transvaginal imaging.   COMPARISON:  None available.   FINDINGS: Uterus   Measurements: 18.0 x 10.7 x 13.9 cm = volume: 1386.3 mL. Uterus is markedly enlarged with multiple fibroids, 3 largest of which are measured as follows;   1. 7.8 x 7.1 x 7.4 cm intramural fibroid at the mid uterine body. 2. 4.9 x 5.6 x 4.2 cm intramural fibroid at the mid posterior uterine body. 3. 5.9 x 5.8 x 5.4 cm subserosal to exophytic fibroid at the right uterine body.   Endometrium   Not visualized, obscured by overlying fibroids.   Right ovary   Measurements: 2.8 x 2.1 x 2.7 cm = volume: 8.1 mL. Normal appearance/no adnexal mass.   Left ovary   Measurements: 3.7 x 2.6 x 2.3 cm = volume: 11.5 mL. Normal appearance/no adnexal mass.   Other findings:  No abnormal free fluid.   IMPRESSION: 1. Enlarged fibroid uterus as detailed above. 2. Nonvisualization of the endometrial stripe, obscured by overlying fibroids. If there remains clinical concern for underlying occult endometrial pathology, further assessment with dedicated sonohysterography and/or pelvic MRI could be performed for further evaluation  as warranted. 3. Normal sonographic appearance of the ovaries. No adnexal mass or free fluid within the pelvis.     Electronically Signed   By: Jeannine Boga M.D.   On: 05/28/2021 20:43       Assessment and Plan:     1. Fibroids 2. Abnormal uterine bleeding (AUB) Discussed medical  management of bleeding, discussed Progestin therapy vs Gonadotropin Releasing Hormone Antagonist  therapy (MyFembree or Oriahnn).  Risks and benefits discussed, all questions answered.  Megace prescribed for now, but also sent MyFembree to specialty pharmacy.  If her insurance can approve this, she will prefer this. If not, she will still to Megace for now.  Also discussed uterine fibroid embolization, myomectomy, hysterectomy as other management options if medical management fails or is inadequate.    Will also check labs today as patient has not had follow up labs in months, received iron infusion in the past.  Will follow up results and manage accordingly. - megestrol (MEGACE) 40 MG tablet; Take 1 tablet (40 mg total) by mouth 2 (two) times daily. Can decrease to 40 mg daily once bleeding stops  Dispense: 60 tablet; Refill: 5 - CBC - Relugolix-Estradiol-Norethind (MYFEMBREE) 40-1-0.5 MG TABS; Take 1 tablet by mouth daily.  Dispense: 30 tablet; Refill: 5  - Iron, TIBC and Ferritin Panel   Routine preventative health maintenance measures emphasized. Please refer to After Visit Summary for other counseling recommendations.   Return for any gynecologic concerns.    I spent 30 minutes dedicated to the care of this patient including pre-visit review of records, face to face time with the patient discussing her conditions and treatments and post visit orders.    Verita Schneiders, MD, [place] for [employer], [place]

## 2022-02-17 NOTE — Patient Instructions (Signed)
Megace is progesterone only - helps with bleeding   MyFembree helps with bleeding and reduction of fibroids

## 2022-02-18 LAB — IRON,TIBC AND FERRITIN PANEL
Ferritin: 6 ng/mL — ABNORMAL LOW (ref 15–150)
Iron Saturation: 4 % — CL (ref 15–55)
Iron: 17 ug/dL — ABNORMAL LOW (ref 27–159)
Total Iron Binding Capacity: 436 ug/dL (ref 250–450)
UIBC: 419 ug/dL (ref 131–425)

## 2022-02-18 LAB — CBC
Hematocrit: 32.6 % — ABNORMAL LOW (ref 34.0–46.6)
Hemoglobin: 10.2 g/dL — ABNORMAL LOW (ref 11.1–15.9)
MCH: 23.7 pg — ABNORMAL LOW (ref 26.6–33.0)
MCHC: 31.3 g/dL — ABNORMAL LOW (ref 31.5–35.7)
MCV: 76 fL — ABNORMAL LOW (ref 79–97)
Platelets: 487 10*3/uL — ABNORMAL HIGH (ref 150–450)
RBC: 4.3 x10E6/uL (ref 3.77–5.28)
RDW: 14.5 % (ref 11.7–15.4)
WBC: 7.2 10*3/uL (ref 3.4–10.8)

## 2022-02-18 NOTE — Telephone Encounter (Signed)
-----   Message from Osborne Oman, MD sent at 02/18/2022 12:29 PM EST ----- Anemia is still present with low iron levels.  Further iron infusion recommended.  This was ordered for patient.  Please call to inform patient of results and recommendations.

## 2022-02-18 NOTE — Addendum Note (Signed)
Addended by: Verita Schneiders A on: 02/18/2022 12:29 PM   Modules accepted: Orders

## 2022-02-19 ENCOUNTER — Telehealth: Payer: No Typology Code available for payment source | Admitting: Obstetrics and Gynecology

## 2022-02-26 ENCOUNTER — Emergency Department: Payer: No Typology Code available for payment source

## 2022-02-26 ENCOUNTER — Emergency Department
Admission: EM | Admit: 2022-02-26 | Discharge: 2022-02-27 | Disposition: A | Discharge status: Home / Self Care | Payer: No Typology Code available for payment source | Attending: Emergency Medicine | Admitting: Emergency Medicine

## 2022-02-26 DIAGNOSIS — D72829 Elevated white blood cell count, unspecified: Secondary | ICD-10-CM | POA: Insufficient documentation

## 2022-02-26 DIAGNOSIS — R102 Pelvic and perineal pain: Secondary | ICD-10-CM

## 2022-02-26 DIAGNOSIS — D259 Leiomyoma of uterus, unspecified: Secondary | ICD-10-CM | POA: Insufficient documentation

## 2022-02-26 DIAGNOSIS — R509 Fever, unspecified: Secondary | ICD-10-CM

## 2022-02-26 DIAGNOSIS — J45909 Unspecified asthma, uncomplicated: Secondary | ICD-10-CM | POA: Diagnosis not present

## 2022-02-26 DIAGNOSIS — R300 Dysuria: Secondary | ICD-10-CM

## 2022-02-26 DIAGNOSIS — Z1152 Encounter for screening for COVID-19: Secondary | ICD-10-CM | POA: Diagnosis not present

## 2022-02-26 DIAGNOSIS — D219 Benign neoplasm of connective and other soft tissue, unspecified: Secondary | ICD-10-CM

## 2022-02-26 DIAGNOSIS — R1030 Lower abdominal pain, unspecified: Secondary | ICD-10-CM

## 2022-02-26 DIAGNOSIS — R1031 Right lower quadrant pain: Secondary | ICD-10-CM | POA: Diagnosis present

## 2022-02-26 LAB — COMPREHENSIVE METABOLIC PANEL
ALT: 23 U/L (ref 0–44)
AST: 27 U/L (ref 15–41)
Albumin: 4.1 g/dL (ref 3.5–5.0)
Alkaline Phosphatase: 70 U/L (ref 38–126)
Anion gap: 9 (ref 5–15)
BUN: 7 mg/dL (ref 6–20)
CO2: 21 mmol/L — ABNORMAL LOW (ref 22–32)
Calcium: 8.6 mg/dL — ABNORMAL LOW (ref 8.9–10.3)
Chloride: 103 mmol/L (ref 98–111)
Creatinine, Ser: 0.6 mg/dL (ref 0.44–1.00)
GFR, Estimated: >60 mL/min (ref >60)
Glucose, Bld: 115 mg/dL — ABNORMAL HIGH (ref 70–99)
Potassium: 3.5 mmol/L (ref 3.5–5.1)
Sodium: 133 mmol/L — ABNORMAL LOW (ref 135–145)
Total Bilirubin: 0.6 mg/dL (ref 0.3–1.2)
Total Protein: 7.8 g/dL (ref 6.5–8.1)

## 2022-02-26 LAB — URINALYSIS, ROUTINE W REFLEX MICROSCOPIC
Bilirubin Urine: NEGATIVE
Glucose, UA: NEGATIVE mg/dL
Ketones, ur: NEGATIVE mg/dL
Leukocytes,Ua: NEGATIVE
Nitrite: NEGATIVE
Protein, ur: 30 mg/dL — AB
Specific Gravity, Urine: 1.019 (ref 1.005–1.030)
pH: 6 (ref 5.0–8.0)

## 2022-02-26 LAB — CBC WITH DIFFERENTIAL/PLATELET
Abs Immature Granulocytes: 0.07 10*3/uL (ref 0.00–0.07)
Basophils Absolute: 0 10*3/uL (ref 0.0–0.1)
Basophils Relative: 0 %
Eosinophils Absolute: 0.1 10*3/uL (ref 0.0–0.5)
Eosinophils Relative: 1 %
HCT: 30.8 % — ABNORMAL LOW (ref 36.0–46.0)
Hemoglobin: 9.2 g/dL — ABNORMAL LOW (ref 12.0–15.0)
Immature Granulocytes: 1 %
Lymphocytes Relative: 12 %
Lymphs Abs: 1.7 10*3/uL (ref 0.7–4.0)
MCH: 23.2 pg — ABNORMAL LOW (ref 26.0–34.0)
MCHC: 29.9 g/dL — ABNORMAL LOW (ref 30.0–36.0)
MCV: 77.8 fL — ABNORMAL LOW (ref 80.0–100.0)
Monocytes Absolute: 0.9 10*3/uL (ref 0.1–1.0)
Monocytes Relative: 7 %
Neutro Abs: 11.1 10*3/uL — ABNORMAL HIGH (ref 1.7–7.7)
Neutrophils Relative %: 79 %
Platelets: 575 10*3/uL — ABNORMAL HIGH (ref 150–400)
RBC: 3.96 MIL/uL (ref 3.87–5.11)
RDW: 15.3 % (ref 11.5–15.5)
WBC: 14 10*3/uL — ABNORMAL HIGH (ref 4.0–10.5)
nRBC: 0 % (ref 0.0–0.2)

## 2022-02-26 LAB — POC URINE PREG, ED
Preg Test, Ur: NEGATIVE
Preg Test, Ur: NEGATIVE

## 2022-02-26 LAB — LACTIC ACID, PLASMA: Lactic Acid, Venous: 1.5 mmol/L (ref 0.5–1.9)

## 2022-02-26 MED ORDER — ACETAMINOPHEN 325 MG PO TABS
650.0000 mg | ORAL_TABLET | Freq: Once | ORAL | Status: AC | PRN
  Administered 2022-02-26: 650 mg via ORAL
  Filled 2022-02-26: qty 2

## 2022-02-26 NOTE — ED Provider Triage Note (Signed)
Emergency Medicine Provider Triage Evaluation Note  Kellie Cowan, a 47 y.o. female  was evaluated in triage.  Pt complains of lower abdominal pain, flank pain and rectal pain.  She presents to the ED from [facility] for further evaluation of her symptoms.  [facility] reports a negative UA and urine pregnancy test.  Patient found to be febrile on presentation.  Review of Systems  Positive: Abd/flank pain  Negative: NVD  Physical Exam  BP 120/70 (BP Location: Left Arm)   Pulse 100   Temp (!) 102 F (38.9 C) (Oral)   Resp 16   Wt 74.8 kg   LMP 02/20/2022   SpO2 100%   BMI 29.70 kg/m  Gen:   Awake, no distress   Resp:  Normal effort  MSK:   Moves extremities without difficulty  Other:    Medical Decision Making  Medically screening exam initiated at 9:51 PM.  Appropriate orders placed.  Kellie Cowan was informed that the remainder of the evaluation will be completed by another provider, this initial triage assessment does not replace that evaluation, and the importance of remaining in the ED until their evaluation is complete.  Patient to the ED for evaluation of lower abdominal pain, as well as flank pain with some referral pain to the rectum.  She presents to the ED from [facility] for further evaluation of her symptoms.   Melvenia Needles, PA-C 02/26/22 2158

## 2022-02-26 NOTE — ED Notes (Signed)
RN asked pt to urinate

## 2022-02-26 NOTE — ED Triage Notes (Signed)
Pt arrives from the [facility] clinic. Pt sts that she has been having abd pain and in the flank and rectal area. Pt sts that she has been having chills at times also.

## 2022-02-26 NOTE — ED Triage Notes (Signed)
First nurse note:  Pt here from [facility] clinic for bilateral flank pain, [facility] states UA was negative.

## 2022-02-27 ENCOUNTER — Emergency Department: Payer: No Typology Code available for payment source

## 2022-02-27 LAB — RESP PANEL BY RT-PCR (RSV, FLU A&B, COVID)  RVPGX2
Influenza A by PCR: NEGATIVE
Influenza B by PCR: NEGATIVE
Resp Syncytial Virus by PCR: NEGATIVE
SARS Coronavirus 2 by RT PCR: NEGATIVE

## 2022-02-27 LAB — MONONUCLEOSIS SCREEN: Mono Screen: NEGATIVE

## 2022-02-27 LAB — LIPASE, BLOOD: Lipase: 47 U/L (ref 11–51)

## 2022-02-27 MED ORDER — HYDROCODONE-ACETAMINOPHEN 5-325 MG PO TABS: 1.0000 | ORAL_TABLET | Freq: Four times a day (QID) | ORAL | 0 refills | Status: DC | PRN

## 2022-02-27 MED ORDER — SODIUM CHLORIDE 0.9 % IV BOLUS
1000.0000 mL | Freq: Once | INTRAVENOUS | Status: AC
  Administered 2022-02-27: 1000 mL via INTRAVENOUS

## 2022-02-27 MED ORDER — CEPHALEXIN 500 MG PO CAPS: 500.0000 mg | ORAL_CAPSULE | Freq: Three times a day (TID) | ORAL | 0 refills | Status: DC

## 2022-02-27 MED ORDER — KETOROLAC TROMETHAMINE 30 MG/ML IJ SOLN
15.0000 mg | Freq: Once | INTRAMUSCULAR | Status: AC
  Administered 2022-02-27: 15 mg via INTRAVENOUS
  Filled 2022-02-27: qty 1

## 2022-02-27 MED ORDER — SODIUM CHLORIDE 0.9 % IV SOLN
1.0000 g | Freq: Once | INTRAVENOUS | Status: AC
  Administered 2022-02-27: 1 g via INTRAVENOUS
  Filled 2022-02-27: qty 10

## 2022-02-27 NOTE — Discharge Instructions (Signed)
1.  Alternate Tylenol and Ibuprofen every 4 hours as needed for fever greater than 100.4 F. 2.  Take antibiotic as prescribed (Keflex 500 mg 3 times daily x 7 days). 3.  You may take Norco as needed for pain. 4.  Return to the ER for worsening symptoms, persistent vomiting, difficulty breathing or other concerns.

## 2022-02-27 NOTE — ED Provider Notes (Signed)
[facility] Provider Note    Event Date/Time   First MD Initiated Contact with Patient 02/27/22 0001     (approximate)   History   Abdominal Pain   HPI  Kellie Cowan is a 47 y.o. female referred to the ED from [place] with a 1 day history of lower abdominal and bilateral flank pain with fever and chills as well as dysuria.  Denies associated cough, chest pain, shortness of breath, nausea, vomiting or diarrhea.  Denies recent travel, camping or antibiotic use.  Known history of fibroids.  Denies history of inflammatory bowel disease.  Denies vaginal discharge or STD concerns.  Going through her divorce and has not been sexually active.     Past Medical History   Past Medical History:  Diagnosis Date   Asthma    Cervical dysplasia    s/p LEEP in 2004   Esophageal stricture    s/p dilation x 2     Active Problem List   Patient Active Problem List   Diagnosis Date Noted   Fibroids 02/17/2022   Abnormal uterine bleeding (AUB) 02/17/2022   Iron deficiency anemia due to chronic blood loss 05/19/2021     Past Surgical History   Past Surgical History:  Procedure Laterality Date   BREAST REDUCTION SURGERY  2000   BREAST REDUCTION SURGERY Bilateral 2000   ESOPHAGEAL DILATION     X2 for stricture   LEEP  2004   TUBAL LIGATION  2007     Home Medications   Prior to Admission medications   Medication Sig Start Date End Date Taking? Authorizing Provider  ferrous gluconate (FERGON) 324 MG tablet Take 1 tablet (324 mg total) by mouth daily with breakfast. Patient not taking: Reported on 02/17/2022 05/19/21   Anyanwu, Sallyanne Havers, MD  megestrol (MEGACE) 40 MG tablet Take 1 tablet (40 mg total) by mouth 2 (two) times daily. Can decrease to 40 mg daily once bleeding stops 02/17/22   Anyanwu, Sallyanne Havers, MD  naproxen sodium (ANAPROX DS) 550 MG tablet Take 1 tablet (550 mg total) by mouth 2 (two) times daily with a meal. Start taking one day before  period and take throughout period, or as needed for pain 05/15/21   Anyanwu, Ugonna A, MD  Relugolix-Estradiol-Norethind (MYFEMBREE) 40-1-0.5 MG TABS Take 1 tablet by mouth daily. 02/17/22   Anyanwu, Sallyanne Havers, MD  tranexamic acid (LYSTEDA) 650 MG TABS tablet Take 2 tablets (1,300 mg total) by mouth 3 (three) times daily. Take during menses for a maximum of five days Patient not taking: Reported on 02/17/2022 05/15/21   Osborne Oman, MD     Allergies  Patient has no known allergies.   Family History  No family history on file.   Physical Exam  Triage Vital Signs: ED Triage Vitals  Enc Vitals Group     BP 02/26/22 1817 (!) 146/100     Pulse Rate 02/26/22 1817 (!) 112     Resp 02/26/22 1817 18     Temp 02/26/22 1817 (!) 102 F (38.9 C)     Temp Source 02/26/22 1817 Oral     SpO2 02/26/22 1817 97 %     Weight 02/26/22 1818 165 lb (74.8 kg)     Height --      Head Circumference --      Peak Flow --      Pain Score 02/26/22 1818 8     Pain Loc --      Pain  Edu? --      Excl. in [place]? --     Updated Vital Signs: BP 126/76 (BP Location: Right Arm)   Pulse 90   Temp 98.2 F (36.8 C) (Oral)   Resp 16   Ht '5\' 2"'$  (1.575 m)   Wt 74.8 kg   LMP 02/20/2022   SpO2 97%   BMI 30.18 kg/m    General: Awake, no distress.  CV:  RRR.  Good peripheral perfusion.  Resp:  Normal effort.  CTAB. Abd:  Mild tenderness to palpation lower abdomen without rebound or guarding.  No distention.  Other:  No truncal vesicles.   ED Results / Procedures / Treatments  Labs (all labs ordered are listed, but only abnormal results are displayed) Labs Reviewed  COMPREHENSIVE METABOLIC PANEL - Abnormal; Notable for the following components:      Result Value   Sodium 133 (*)    CO2 21 (*)    Glucose, Bld 115 (*)    Calcium 8.6 (*)    All other components within normal limits  CBC WITH DIFFERENTIAL/PLATELET - Abnormal; Notable for the following components:   WBC 14.0 (*)    Hemoglobin 9.2 (*)     HCT 30.8 (*)    MCV 77.8 (*)    MCH 23.2 (*)    MCHC 29.9 (*)    Platelets 575 (*)    Neutro Abs 11.1 (*)    All other components within normal limits  URINALYSIS, ROUTINE W REFLEX MICROSCOPIC - Abnormal; Notable for the following components:   Color, Urine YELLOW (*)    APPearance CLEAR (*)    Hgb urine dipstick MODERATE (*)    Protein, ur 30 (*)    Bacteria, UA RARE (*)    All other components within normal limits  RESP PANEL BY RT-PCR (RSV, FLU A&B, COVID)  RVPGX2  CULTURE, BLOOD (ROUTINE X 2)  CULTURE, BLOOD (ROUTINE X 2)  URINE CULTURE  LACTIC ACID, PLASMA  LIPASE, BLOOD  MONONUCLEOSIS SCREEN  POC URINE PREG, ED  POC URINE PREG, ED     EKG  None   RADIOLOGY I have independently visualized and interpreted patient's chest x-ray, CT and ultrasound as well as noted the radiology interpretation:  CT renal stone study: No acute abnormality other than multiple fibroids  X-ray: No acute cardiopulmonary process  Pelvic ultrasound: Uterine fibroids  Official radiology report(s): US PELVIS (TRANSABDOMINAL ONLY)  Result Date: 02/27/2022 CLINICAL DATA:  Initial evaluation for acute pelvic pain. EXAM: TRANSABDOMINAL ULTRASOUND OF PELVIS DOPPLER ULTRASOUND OF OVARIES TECHNIQUE: Transabdominal ultrasound examination of the pelvis was performed including evaluation of the uterus, ovaries, adnexal regions, and pelvic cul-de-sac. Color and duplex Doppler ultrasound was utilized to evaluate blood flow to the ovaries. COMPARISON:  CT from earlier the same day. FINDINGS: Uterus Measurements: 18.0 x 10.0 x 13.8 cm = volume: 1298.2 mL. Uterus is anteverted with several large fibroid seen as follows; 1. 10.1 x 7.5 x 9.5 cm intramural fibroid seen at the central uterine body. 2. 5.3 x 4.6 x 5.7 cm subserosal fibroid present at the right uterine fundus. 3. 5.9 x 5.5 x 5.3 cm intramural to subserosal fibroid present at the right uterine body. Endometrium Not visualized, obscured by overlying  fibroids. Right ovary Measurements: 3.0 x 2.0 x 2.8 cm = volume: 8.6 mL. Normal appearance/no adnexal mass. Left ovary Measurements: 3.4 x 2.5 x 2.0 cm = volume: 8.9 mL. Normal appearance/no adnexal mass. Pulsed Doppler evaluation demonstrates normal low-resistance arterial and venous waveforms in  both ovaries. Other: No free fluid seen within the pelvis. IMPRESSION: 1. Enlarged fibroid uterus as detailed above. 2. Nonvisualization of the endometrium, obscured by overlying fibroids. 3. Normal sonographic appearance of the ovaries. No evidence for torsion or other acute abnormality. Electronically Signed   By: Jeannine Boga M.D.   On: 02/27/2022 03:05   US PELVIC DOPPLER (TORSION R/O OR MASS ARTERIAL FLOW)  Result Date: 02/27/2022 CLINICAL DATA:  Initial evaluation for acute pelvic pain. EXAM: TRANSABDOMINAL ULTRASOUND OF PELVIS DOPPLER ULTRASOUND OF OVARIES TECHNIQUE: Transabdominal ultrasound examination of the pelvis was performed including evaluation of the uterus, ovaries, adnexal regions, and pelvic cul-de-sac. Color and duplex Doppler ultrasound was utilized to evaluate blood flow to the ovaries. COMPARISON:  CT from earlier the same day. FINDINGS: Uterus Measurements: 18.0 x 10.0 x 13.8 cm = volume: 1298.2 mL. Uterus is anteverted with several large fibroid seen as follows; 1. 10.1 x 7.5 x 9.5 cm intramural fibroid seen at the central uterine body. 2. 5.3 x 4.6 x 5.7 cm subserosal fibroid present at the right uterine fundus. 3. 5.9 x 5.5 x 5.3 cm intramural to subserosal fibroid present at the right uterine body. Endometrium Not visualized, obscured by overlying fibroids. Right ovary Measurements: 3.0 x 2.0 x 2.8 cm = volume: 8.6 mL. Normal appearance/no adnexal mass. Left ovary Measurements: 3.4 x 2.5 x 2.0 cm = volume: 8.9 mL. Normal appearance/no adnexal mass. Pulsed Doppler evaluation demonstrates normal low-resistance arterial and venous waveforms in both ovaries. Other: No free fluid seen  within the pelvis. IMPRESSION: 1. Enlarged fibroid uterus as detailed above. 2. Nonvisualization of the endometrium, obscured by overlying fibroids. 3. Normal sonographic appearance of the ovaries. No evidence for torsion or other acute abnormality. Electronically Signed   By: Jeannine Boga M.D.   On: 02/27/2022 03:05   CT Renal Stone Study  Result Date: 02/26/2022 CLINICAL DATA:  Abdomen flank pain EXAM: CT ABDOMEN AND PELVIS WITHOUT CONTRAST TECHNIQUE: Multidetector CT imaging of the abdomen and pelvis was performed following the standard protocol without IV contrast. RADIATION DOSE REDUCTION: This exam was performed according to the departmental dose-optimization program which includes automated exposure control, adjustment of the mA and/or kV according to patient size and/or use of iterative reconstruction technique. COMPARISON:  None Available. FINDINGS: Lower chest: No acute abnormality. Hepatobiliary: No focal liver abnormality is seen. No gallstones, gallbladder wall thickening, or biliary dilatation. Pancreas: Unremarkable. No pancreatic ductal dilatation or surrounding inflammatory changes. Spleen: Normal in size without focal abnormality. Adrenals/Urinary Tract: Adrenal glands are unremarkable. Kidneys are normal, without renal calculi, focal lesion, or hydronephrosis. Bladder is unremarkable. Stomach/Bowel: Stomach is within normal limits. Appendix appears normal. No evidence of bowel wall thickening, distention, or inflammatory changes. Vascular/Lymphatic: No significant vascular findings are present. No enlarged abdominal or pelvic lymph nodes. Reproductive: Enlarged uterus with multiple masses. No adnexal mass. Other: Negative for pelvic effusion or free air Musculoskeletal: No acute or significant osseous findings. IMPRESSION: 1. No CT evidence for acute intra-abdominal or pelvic abnormality. Negative for hydronephrosis or ureteral stone. 2. Enlarged uterus with multiple fibroids. Uterus  extends above the level of umbilicus. Electronically Signed   By: Donavan Foil M.D.   On: 02/26/2022 22:33   DG Chest 2 View  Result Date: 02/26/2022 CLINICAL DATA:  Flank pain EXAM: CHEST - 2 VIEW COMPARISON:  None Available. FINDINGS: The heart size and mediastinal contours are within normal limits. Both lungs are clear. The visualized skeletal structures are unremarkable. IMPRESSION: No active cardiopulmonary disease. Electronically Signed  By: Yetta Glassman M.D.   On: 02/26/2022 18:40     PROCEDURES:  Critical Care performed: No  Procedures   MEDICATIONS ORDERED IN ED: Medications  cefTRIAXone (ROCEPHIN) 1 g in sodium chloride 0.9 % 100 mL IVPB (has no administration in time range)  acetaminophen (TYLENOL) tablet 650 mg (650 mg Oral Given 02/26/22 2039)  sodium chloride 0.9 % bolus 1,000 mL (1,000 mLs Intravenous New Bag/Given 02/27/22 0045)  ketorolac (TORADOL) 30 MG/ML injection 15 mg (15 mg Intravenous Given 02/27/22 0044)     IMPRESSION / MDM / ASSESSMENT AND PLAN / ED COURSE  I reviewed the triage vital signs and the nursing notes.                             47 year old female presenting with lower abdominal pain, flank pain and fever. Differential diagnosis includes, but is not limited to, ovarian cyst, ovarian torsion, acute appendicitis, diverticulitis, urinary tract infection/pyelonephritis, endometriosis, bowel obstruction, colitis, renal colic, gastroenteritis, hernia, fibroids, endometriosis, pregnancy related pain including ectopic pregnancy, etc. I have personally reviewed patient's records and note her [facility] visit from yesterday.  Patient's presentation is most consistent with acute presentation with potential threat to life or bodily function.  Laboratory results demonstrate moderate leukocytosis WBC of 14, normal electrolytes, negative UA, unremarkable lactic acid.  CT imaging and chest x-ray unremarkable.  Will add blood cultures, urine culture,  Monospot, respiratory panel and obtain pelvic ultrasound.  Administer IV fluids and Toradol; will reassess.  Clinical Course as of 02/27/22 0315  Ludwig Clarks Feb 27, 2022  7989 Ultrasound demonstrates fibroids, otherwise unremarkable.  Monospot and respiratory panel negative.  Treating empirically for UTI with IV Rocephin and transition to oral Keflex.  Will prescribe as needed Norco and patient will follow-up with her GYN.  Strict return precautions given.  Patient verbalizes understanding and agrees with plan of care. [JS]    Clinical Course User Index [JS] Paulette Blanch, MD     FINAL CLINICAL IMPRESSION(S) / ED DIAGNOSES   Final diagnoses:  Lower abdominal pain  Pelvic pain in female  Fibroids  Fever, unspecified fever cause  Dysuria     Rx / DC Orders   ED Discharge Orders     None        Note:  This document was prepared using Dragon voice recognition software and may include unintentional dictation errors.   Paulette Blanch, MD 02/27/22 [phone]

## 2022-02-28 LAB — URINE CULTURE: Culture: <10000 — AB

## 2022-03-03 ENCOUNTER — Ambulatory Visit: Payer: Self-pay

## 2022-03-03 ENCOUNTER — Encounter (HOSPITAL_COMMUNITY): Payer: No Typology Code available for payment source

## 2022-03-03 NOTE — Telephone Encounter (Signed)
      Chief Complaint: Fever - 100.9-101 Symptoms: None Frequency: Since ED visit Pertinent Negatives: Patient denies any pain Disposition: '[]'$ ED /'[x]'$ Urgent Care (no appt availability in office) / '[]'$ Appointment(In office/virtual)/ '[]'$  [place] [facility]/ '[]'$ Home Care/ '[]'$ Refused Recommended Disposition /'[]'$ Home Mobile Bus/ '[]'$  Follow-up with PCP Additional Notes:   Reason for Disposition  Fever present > 3 days (72 hours)  Answer Assessment - Initial Assessment Questions 1. TEMPERATURE: "What is the most recent temperature?"  "How was it measured?"      100.9- 101 2. ONSET: "When did the fever start?"      Last week 3. CHILLS: "Do you have chills?" If yes: "How bad are they?"  (e.g., none, mild, moderate, severe)   - NONE: no chills   - MILD: feeling cold   - MODERATE: feeling very cold, some shivering (feels better under a thick blanket)   - SEVERE: feeling extremely cold with shaking chills (general body shaking, rigors; even under a thick blanket)      No 4. OTHER SYMPTOMS: "Do you have any other symptoms besides the fever?"  (e.g., abdomen pain, cough, diarrhea, earache, headache, sore throat, urination pain)     No 5. CAUSE: If there are no symptoms, ask: "What do you think is causing the fever?"      Unsure 6. CONTACTS: "Does anyone else in the family have an infection?"     No 7. TREATMENT: "What have you done so far to treat this fever?" (e.g., medications)     Tylenol 8. IMMUNOCOMPROMISE: "Do you have of the following: diabetes, HIV positive, splenectomy, cancer chemotherapy, chronic steroid treatment, transplant patient, etc."     No 9. PREGNANCY: "Is there any chance you are pregnant?" "When was your last menstrual period?"     No 10. TRAVEL: "Have you traveled out of the country in the last month?" (e.g., travel history, exposures)       No  Protocols used: [facility]

## 2022-03-04 ENCOUNTER — Ambulatory Visit: Payer: No Typology Code available for payment source | Admitting: Family Medicine

## 2022-03-04 LAB — CULTURE, BLOOD (ROUTINE X 2)
Culture: NO GROWTH
Culture: NO GROWTH
Special Requests: ADEQUATE
Special Requests: ADEQUATE

## 2022-03-06 ENCOUNTER — Encounter: Payer: Self-pay | Admitting: Internal Medicine

## 2022-03-06 ENCOUNTER — Ambulatory Visit (INDEPENDENT_AMBULATORY_CARE_PROVIDER_SITE_OTHER): Payer: No Typology Code available for payment source | Admitting: Internal Medicine

## 2022-03-06 ENCOUNTER — Other Ambulatory Visit: Payer: Self-pay

## 2022-03-06 VITALS — BP 148/86 | HR 92 | Temp 97.5°F | Resp 16 | Ht 62.0 in | Wt 162.0 lb

## 2022-03-06 DIAGNOSIS — R509 Fever, unspecified: Secondary | ICD-10-CM | POA: Diagnosis not present

## 2022-03-06 NOTE — Progress Notes (Signed)
[facility]      Reason for Consult: fever    Referring Physician: Dr. Sherilyn Cooter    Patient ID: Noralee Chars, female    DOB: 1975-05-11, 47 y.o.   MRN: [id number]  HPI:   Adaysha is here for evaluation of a fever. She has a history of iron deficiency anemia on iron and was initially seen in urgent care 02/26/22 for rectal, abdominal and back pain associated with fever up to 101.8.  she also complained of urinary frequency and suprapubic pain.  She was then sent to the ED for evaluation that day and work up was unrevealing including a negative CT scan of abdomen, negative urinalysis, mildly elevated WBC and started on Keflex.  She then returned to the ED 03/03/22 and again work up unrevealing and she was given cefdinir which she was taking and referred here.   Having some high temperatures up to 100.3, mainly in the evenings.  Some runny nose but no other associated symptoms now.  No shortness of breath, some loose stool today.  No cough, no headache, no abdominal pain now.  Menstrual period ongoing since earlier this month.   Past Medical History:  Diagnosis Date   Asthma    Cervical dysplasia    s/p LEEP in 2004   Esophageal stricture    s/p dilation x 2    Prior to Admission medications   Medication Sig Start Date End Date Taking? Authorizing Provider  cephALEXin (KEFLEX) 500 MG capsule Take 1 capsule (500 mg total) by mouth 3 (three) times daily. 02/27/22   Paulette Blanch, MD  ferrous gluconate (FERGON) 324 MG tablet Take 1 tablet (324 mg total) by mouth daily with breakfast. Patient not taking: Reported on 02/17/2022 05/19/21   Anyanwu, Sallyanne Havers, MD  HYDROcodone-acetaminophen (NORCO) 5-325 MG tablet Take 1 tablet by mouth every 6 (six) hours as needed for moderate pain. 02/27/22   Paulette Blanch, MD  megestrol (MEGACE) 40 MG tablet Take 1 tablet (40 mg total) by mouth 2 (two) times daily. Can decrease to 40 mg daily once bleeding stops 02/17/22   Anyanwu, Sallyanne Havers, MD   naproxen sodium (ANAPROX DS) 550 MG tablet Take 1 tablet (550 mg total) by mouth 2 (two) times daily with a meal. Start taking one day before period and take throughout period, or as needed for pain 05/15/21   Anyanwu, Ugonna A, MD  Relugolix-Estradiol-Norethind (MYFEMBREE) 40-1-0.5 MG TABS Take 1 tablet by mouth daily. 02/17/22   Anyanwu, Sallyanne Havers, MD  tranexamic acid (LYSTEDA) 650 MG TABS tablet Take 2 tablets (1,300 mg total) by mouth 3 (three) times daily. Take during menses for a maximum of five days Patient not taking: Reported on 02/17/2022 05/15/21   Osborne Oman, MD    No Known Allergies  Social History   Tobacco Use   Smoking status: Never   Smokeless tobacco: Never  Vaping Use   Vaping Use: Never used  Substance Use Topics   Alcohol use: Not Currently    Comment: occ   Drug use: Never    No family history on file.  Review of Systems  Constitutional: positive for malaise and night sweats or negative for chills, anorexia, and weight loss All other systems reviewed and are negative    Constitutional: in no apparent distress There were no vitals filed for this visit. EYES: anicteric Respiratory: normal respiratory effort Musculoskeletal: no edema  Labs: Lab Results  Component Value Date   WBC  14.0 (H) 02/26/2022   HGB 9.2 (L) 02/26/2022   HCT 30.8 (L) 02/26/2022   MCV 77.8 (L) 02/26/2022   PLT 575 (H) 02/26/2022    Lab Results  Component Value Date   CREATININE 0.60 02/26/2022   BUN 7 02/26/2022   NA 133 (L) 02/26/2022   K 3.5 02/26/2022   CL 103 02/26/2022   CO2 21 (L) 02/26/2022    Lab Results  Component Value Date   ALT 23 02/26/2022   AST 27 02/26/2022   ALKPHOS 70 02/26/2022   BILITOT 0.6 02/26/2022     Assessment: viral illness, nos.  No concerning symptoms.  Negative UA for infection so no role for antibiotics.   Still with some temperatures up to 100.3.  She may be developing a new viral illness.  Also may be due to fibroids, perimenopausal  symptoms.  Has follow up with gynecology next week  Plan: 1)  stop antibiotics 2) continue supportive care Follow up as needed

## 2022-03-11 ENCOUNTER — Ambulatory Visit: Payer: No Typology Code available for payment source | Admitting: Family Medicine

## 2022-03-11 VITALS — BP 144/78 | HR 99 | Wt 160.0 lb

## 2022-03-11 DIAGNOSIS — N939 Abnormal uterine and vaginal bleeding, unspecified: Secondary | ICD-10-CM

## 2022-03-11 DIAGNOSIS — D500 Iron deficiency anemia secondary to blood loss (chronic): Secondary | ICD-10-CM | POA: Diagnosis not present

## 2022-03-11 DIAGNOSIS — D219 Benign neoplasm of connective and other soft tissue, unspecified: Secondary | ICD-10-CM

## 2022-03-11 NOTE — Assessment & Plan Note (Signed)
Starts IV iron infusions next week.

## 2022-03-11 NOTE — Assessment & Plan Note (Addendum)
Suspect recent fibroid infarction as cause of severe abdominal pain and fevers. After consideration of all meds, desires UFE. Should not have myomectomy, as she no longer desires child bearing. Does not want hysterectomy. IUD insertion, endometrial ablation, po progesterone, MyFembree discussed. Discussed Sonata as well. Recent u/s shows enlarging fibroids. IR referral made.

## 2022-03-11 NOTE — Assessment & Plan Note (Signed)
Likely related to fibroids--should r/o endometrial pathology--bleeding heavily today--will reschedule back. Megace for bleeding, discussed usual side effects.

## 2022-03-11 NOTE — Progress Notes (Signed)
Patient here to F/U from ED visit on 02/27/22 for abdominal pain.   LMP : 02/20/22 still on cycle and it is very heavy.   Currently on FMLA wants extension.

## 2022-03-11 NOTE — Progress Notes (Signed)
   Subjective:    Patient ID: Kellie Cowan is a 47 y.o. female presenting with Follow-up  on 03/11/2022  HPI: Patient is here for f/u ED visit. Seen there with abdominal pain. Noted to have fever and went to ED. Treated for UTI, no infection, had severe abdominal pain.  Seen by ID and stopped her antibiotics as her cultures were negative. Has known large uterine fibroids (18 week size). She has not had endometrial sampling. She has declined surgical management in the past. SVD x 3 and BTL. Cycle started on 1/5 and is still going. Previously prescribed Megace and she is concerned that this might have started the process. She has stopped taking this. Feels like her fibroids are upsetting her life.  Review of Systems  Constitutional:  Negative for chills and fever.  Respiratory:  Negative for shortness of breath.   Cardiovascular:  Negative for chest pain.  Gastrointestinal:  Negative for abdominal pain, nausea and vomiting.  Genitourinary:  Negative for dysuria.  Skin:  Negative for rash.      Objective:    BP (!) 144/78   Pulse 99   Wt 160 lb (72.6 kg)   LMP 02/20/2022   BMI 29.26 kg/m  Physical Exam Exam conducted with a chaperone present.  Constitutional:      General: She is not in acute distress.    Appearance: She is well-developed.  HENT:     Head: Normocephalic and atraumatic.  Eyes:     General: No scleral icterus. Cardiovascular:     Rate and Rhythm: Normal rate.  Pulmonary:     Effort: Pulmonary effort is normal.  Abdominal:     Palpations: Abdomen is soft.  Musculoskeletal:     Cervical back: Neck supple.  Skin:    General: Skin is warm and dry.  Neurological:     Mental Status: She is alert and oriented to person, place, and time.         Assessment & Plan:   Problem List Items Addressed This Visit       Unprioritized   Iron deficiency anemia due to chronic blood loss    Starts IV iron infusions next week.      Relevant Orders   Ambulatory  referral to Interventional Radiology   Fibroids - Primary    Suspect recent fibroid infarction as cause of severe abdominal pain and fevers. After consideration of all meds, desires UFE. Should not have myomectomy, as she no longer desires child bearing. Does not want hysterectomy. IUD insertion, endometrial ablation, po progesterone, MyFembree discussed. Discussed Sonata as well. Recent u/s shows enlarging fibroids. IR referral made.       Relevant Orders   Ambulatory referral to Interventional Radiology   Abnormal uterine bleeding (AUB)    Likely related to fibroids--should r/o endometrial pathology--bleeding heavily today--will reschedule back. Megace for bleeding, discussed usual side effects.      Relevant Orders   Ambulatory referral to Interventional Radiology    Return in about 2 weeks (around 03/25/2022) for endometrial biopsy.  Donnamae Jude, MD 03/11/2022 11:27 AM

## 2022-03-12 ENCOUNTER — Encounter: Payer: Self-pay | Admitting: *Deleted

## 2022-03-19 ENCOUNTER — Encounter (HOSPITAL_COMMUNITY)
Admission: RE | Admit: 2022-03-19 | Discharge: 2022-03-19 | Disposition: A | Discharge status: Home / Self Care | Payer: No Typology Code available for payment source | Source: Ambulatory Visit | Attending: Obstetrics & Gynecology | Admitting: Obstetrics & Gynecology

## 2022-03-19 DIAGNOSIS — D500 Iron deficiency anemia secondary to blood loss (chronic): Secondary | ICD-10-CM | POA: Insufficient documentation

## 2022-03-19 MED ORDER — SODIUM CHLORIDE 0.9 % IV SOLN
300.0000 mg | INTRAVENOUS | Status: DC
  Administered 2022-03-19: 300 mg via INTRAVENOUS
  Filled 2022-03-19: qty 15

## 2022-03-26 ENCOUNTER — Encounter (HOSPITAL_COMMUNITY)
Admission: RE | Admit: 2022-03-26 | Discharge: 2022-03-26 | Disposition: A | Discharge status: Home / Self Care | Payer: No Typology Code available for payment source | Source: Ambulatory Visit | Attending: Obstetrics & Gynecology | Admitting: Obstetrics & Gynecology

## 2022-03-26 DIAGNOSIS — D500 Iron deficiency anemia secondary to blood loss (chronic): Secondary | ICD-10-CM | POA: Diagnosis not present

## 2022-03-26 MED ORDER — SODIUM CHLORIDE 0.9 % IV SOLN
300.0000 mg | INTRAVENOUS | Status: DC
  Administered 2022-03-26: 300 mg via INTRAVENOUS
  Filled 2022-03-26: qty 15

## 2022-04-01 ENCOUNTER — Other Ambulatory Visit (HOSPITAL_COMMUNITY)
Admission: RE | Admit: 2022-04-01 | Discharge: 2022-04-01 | Disposition: A | Discharge status: Home / Self Care | Payer: No Typology Code available for payment source | Source: Ambulatory Visit | Attending: Family Medicine | Admitting: Family Medicine

## 2022-04-01 ENCOUNTER — Ambulatory Visit (INDEPENDENT_AMBULATORY_CARE_PROVIDER_SITE_OTHER): Payer: No Typology Code available for payment source | Admitting: Family Medicine

## 2022-04-01 ENCOUNTER — Encounter: Payer: Self-pay | Admitting: Family Medicine

## 2022-04-01 VITALS — BP 122/76 | HR 61 | Wt 158.0 lb

## 2022-04-01 DIAGNOSIS — N939 Abnormal uterine and vaginal bleeding, unspecified: Secondary | ICD-10-CM | POA: Diagnosis present

## 2022-04-01 DIAGNOSIS — D219 Benign neoplasm of connective and other soft tissue, unspecified: Secondary | ICD-10-CM

## 2022-04-01 LAB — POCT URINE PREGNANCY: Preg Test, Ur: NEGATIVE

## 2022-04-01 NOTE — Assessment & Plan Note (Signed)
Desires UFE as definitive treatment.

## 2022-04-01 NOTE — Progress Notes (Signed)
RGYN patient here for EMB due to AUB.  Pt is taking Megace still having bleeding just is not as heavy right now  CC: Pelvic pain x 1 wk now. also notes fatigue. Has has scheduled IV infusions.

## 2022-04-01 NOTE — Progress Notes (Signed)
   Subjective:    Patient ID: Kellie Cowan is a 47 y.o. female presenting with AUB  on 04/01/2022  HPI: Patient returns. She is on megace and is still spotting but bleeding is less. She needs EMB. Desires UFE for definitive treatment.  Review of Systems  Constitutional:  Negative for chills and fever.  Respiratory:  Negative for shortness of breath.   Cardiovascular:  Negative for chest pain.  Gastrointestinal:  Negative for abdominal pain, nausea and vomiting.  Genitourinary:  Positive for vaginal bleeding (light spotting). Negative for dysuria.  Skin:  Negative for rash.      Objective:    BP 122/76   Pulse 61   Wt 158 lb (71.7 kg)   BMI 28.90 kg/m  Physical Exam Exam conducted with a chaperone present.  Constitutional:      General: She is not in acute distress.    Appearance: She is well-developed.  HENT:     Head: Normocephalic and atraumatic.  Eyes:     General: No scleral icterus. Cardiovascular:     Rate and Rhythm: Normal rate.  Pulmonary:     Effort: Pulmonary effort is normal.  Abdominal:     Palpations: Abdomen is soft.  Genitourinary:    Comments: BUS normal, vagina is pink and rugated, cervix is without lesion  Musculoskeletal:     Cervical back: Neck supple.  Skin:    General: Skin is warm and dry.  Neurological:     Mental Status: She is alert and oriented to person, place, and time.    Procedure: Patient given informed consent, signed copy in the chart, time out was performed. Appropriate time out taken. . The patient was placed in the lithotomy position and the cervix brought into view with sterile speculum.  Portio of cervix cleansed x 2 with betadine swabs.  A tenaculum was placed in the anterior lip of the cervix.  The uterus was sounded for depth of 13 cm. A pipelle was introduced to into the uterus, suction created,  and an endometrial sample was obtained. This was repeated x 3 as mostly fluid obtained the first time. All equipment was  removed and accounted for. Bleeding at tenaculum site and AgNO3 used for hemostasis.  The patient tolerated the procedure well.       Assessment & Plan:   Problem List Items Addressed This Visit       Unprioritized   Fibroids    Desires UFE as definitive treatment.      Abnormal uterine bleeding (AUB) - Primary    s/p EMB--once back--will schedule with IR--minimal bleeding right now on Megace       Relevant Orders   Surgical pathology( [facility])      Return if symptoms worsen or fail to improve.  Donnamae Jude, MD 04/01/2022 10:16 AM

## 2022-04-01 NOTE — Assessment & Plan Note (Signed)
s/p EMB--once back--will schedule with IR--minimal bleeding right now on Megace

## 2022-04-03 LAB — SURGICAL PATHOLOGY

## 2022-04-07 ENCOUNTER — Other Ambulatory Visit: Payer: Self-pay | Admitting: Family Medicine

## 2022-04-07 DIAGNOSIS — D259 Leiomyoma of uterus, unspecified: Secondary | ICD-10-CM

## 2022-04-10 ENCOUNTER — Inpatient Hospital Stay: Admission: RE | Admit: 2022-04-10 | Payer: No Typology Code available for payment source | Source: Ambulatory Visit

## 2022-04-10 ENCOUNTER — Other Ambulatory Visit: Payer: No Typology Code available for payment source

## 2022-04-15 ENCOUNTER — Other Ambulatory Visit: Payer: Self-pay | Admitting: Family Medicine

## 2022-04-15 DIAGNOSIS — D219 Benign neoplasm of connective and other soft tissue, unspecified: Secondary | ICD-10-CM

## 2022-04-16 ENCOUNTER — Encounter: Payer: Self-pay | Admitting: *Deleted

## 2022-04-20 ENCOUNTER — Ambulatory Visit
Admission: RE | Admit: 2022-04-20 | Discharge: 2022-04-20 | Discharge status: Home / Self Care | Payer: No Typology Code available for payment source | Source: Ambulatory Visit | Attending: Family Medicine | Admitting: Family Medicine

## 2022-04-20 DIAGNOSIS — D219 Benign neoplasm of connective and other soft tissue, unspecified: Secondary | ICD-10-CM

## 2022-04-20 MED ORDER — GADOPICLENOL 0.5 MMOL/ML IV SOLN
7.5000 mL | Freq: Once | INTRAVENOUS | Status: AC | PRN
  Administered 2022-04-20: 7.5 mL via INTRAVENOUS

## 2022-04-21 ENCOUNTER — Ambulatory Visit
Admission: RE | Admit: 2022-04-21 | Discharge: 2022-04-21 | Disposition: A | Discharge status: Home / Self Care | Payer: No Typology Code available for payment source | Source: Ambulatory Visit | Attending: Family Medicine | Admitting: Family Medicine

## 2022-04-21 DIAGNOSIS — D259 Leiomyoma of uterus, unspecified: Secondary | ICD-10-CM

## 2022-04-21 HISTORY — PX: IR RADIOLOGIST EVAL & MGMT: IMG5224

## 2022-04-21 NOTE — Consult Note (Signed)
Chief Complaint: Patient was seen in consultation today for uterine fibroids at the request of Pratt,Tanya S  Referring Physician(s): Pratt,Tanya S  History of Present Illness: Kellie Cowan is a 47 y.o. female, G4 P3, Who presents to the minimally invasive therapies for symptomatic uterine fibroids.  She has a long history of menorrhagia and uterine fibroids dating back to 2019.  However, since January she has had near nonstop bleeding.  She is currently on Megace which has slowed the bleeding down somewhat.  Her cycles occur monthly and last for approximately 10 to 12 days.  The first 3 days are extremely heavy to the point where she cannot leave the house.  She passes fist folds of clots.  She has anemia.  Her most recent hemoglobin was 8.7.  She has had 2 iron infusions.  Additional prior treatments include p.o. progesterone, IUD insertion and endometrial ablation which have been unsuccessful.  Pap smear on 05/20/2021 was normal.  Endometrial biopsy on 04/01/2022 was within normal limits.  She had an MRI yesterday 04/20/2022.  Of note, her multiple fibroids demonstrate varying degrees of internal complexity most compatible with varying states of degeneration.  However, a leiomyosarcoma could not be excluded by imaging alone.  In particular, there is 1 larger fibroid in the left aspect of the uterus that has a slightly more complex imaging appearance than the others.  Past Medical History:  Diagnosis Date   Asthma    Cervical dysplasia    s/p LEEP in 2004   Esophageal stricture    s/p dilation x 2    Past Surgical History:  Procedure Laterality Date   BREAST REDUCTION SURGERY  2000   BREAST REDUCTION SURGERY Bilateral 2000   ESOPHAGEAL DILATION     X2 for stricture   IR RADIOLOGIST EVAL & MGMT  04/21/2022   LEEP  2004   TUBAL LIGATION  2007    Allergies: Patient has no known allergies.  Medications: Prior to Admission medications   Medication Sig Start Date End Date  Taking? Authorizing Provider  ferrous gluconate (FERGON) 324 MG tablet Take 324 mg by mouth every morning. 04/12/22  Yes [provider]  megestrol (MEGACE) 40 MG tablet Take 1 tablet (40 mg total) by mouth 2 (two) times daily. Can decrease to 40 mg daily once bleeding stops 02/17/22   Anyanwu, Sallyanne Havers, MD  Multiple Vitamin (MULTIVITAMIN) tablet Take 1 tablet by mouth daily.    [provider]     No family history on file.  Social History   Socioeconomic History   Marital status: Legally Separated    Spouse name: Not on file   Number of children: Not on file   Years of education: Not on file   Highest education level: Not on file  Occupational History   Occupation: Freight forwarder for performance improvement    Employer: [employer]  Tobacco Use   Smoking status: Never   Smokeless tobacco: Never  Vaping Use   Vaping Use: Never used  Substance and Sexual Activity   Alcohol use: Not Currently    Comment: occ   Drug use: Never   Sexual activity: Not Currently    Partners: Male    Birth control/protection: Surgical  Other Topics Concern   Not on file  Social History Narrative   Not on file   Social Determinants of Health   Financial Resource Strain: Not on file  Food Insecurity: Not on file  Transportation Needs: Not on file  Physical Activity:  Not on file  Stress: Not on file  Social Connections: Not on file    Review of Systems: A 12 point ROS discussed and pertinent positives are indicated in the HPI above.  All other systems are negative.  Review of Systems  Vital Signs: BP (!) 140/84 (BP Location: Right Arm, Patient Position: Sitting, Cuff Size: Normal)   Pulse 76   Temp 98.6 F (37 C) (Oral)   Resp 14   SpO2 99%     Physical Exam Constitutional:      Appearance: Normal appearance.  HENT:     Head: Normocephalic and atraumatic.  Eyes:     General: No scleral icterus. Cardiovascular:     Rate and Rhythm: Normal rate.  Pulmonary:      Effort: Pulmonary effort is normal.  Abdominal:     General: Abdomen is flat.     Palpations: Abdomen is soft.  Skin:    General: Skin is warm and dry.  Neurological:     Mental Status: She is alert and oriented to person, place, and time.  Psychiatric:        Behavior: Behavior normal.       Imaging: IR Radiologist Eval & Mgmt  Result Date: 04/21/2022 EXAM: NEW PATIENT OFFICE VISIT CHIEF COMPLAINT: SEE EPIC NOTE HISTORY OF PRESENT ILLNESS: SEE EPIC NOTE REVIEW OF SYSTEMS: SEE EPIC NOTE PHYSICAL EXAMINATION: SEE EPIC NOTE ASSESSMENT AND PLAN: SEE EPIC NOTE Electronically Signed   By: Jacqulynn Cadet M.D.   On: 04/21/2022 13:05   MR PELVIS W WO CONTRAST  Result Date: 04/20/2022 CLINICAL DATA:  History of multiple uterine fibroids having [place] consult tomorrow. EXAM: MRI PELVIS WITHOUT AND WITH CONTRAST TECHNIQUE: Multiplanar multisequence MR imaging of the pelvis was performed both before and after administration of intravenous contrast. CONTRAST:  7.5 cc of Vueway COMPARISON:  CT February 26, 2022. FINDINGS: Urinary Tract: No acute abnormality. Bowel: Unremarkable pelvic bowel loops. Vascular/Lymphatic: No pathologically enlarged lymph nodes or other significant abnormality. Reproductive: Uterus: Measures 13.1 x 9.6 x 17.3 cm. Heterogeneous intramural lesion in the lower uterine segment measuring 7.3 x 6.5 x 9.8 cm on images 13/3 and 20/5 demonstrates central area which is T2 hyperintense T1 mildly hyperintense with discontinuous enhancing peripheral nodularity that is T2 hypointense. Additional uterine lesions of varying degrees of complexity including an enhancing submucosal lesion in the mid uterine segment measuring 4.4 x 2.7 x 2.0 cm on images 45/23 and 19/101 and a submucosal lesion demonstrating similar imaging characteristics measuring 4.2 x 3.1 cm on image 12/101. Right ovary: Appears normal. No mass identified. Left ovary:  Appears normal. No mass identified. Other: No significant pelvic  free fluid. Musculoskeletal:  No aggressive osseous lesion. 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 Of note, strictly speaking while not favored, given the degree of lesional complexity and size, a leiomyosarcoma can not be excluded on imaging alone. Electronically Signed   By: Dahlia Bailiff M.D.   On: 04/20/2022 15:48    Labs:  CBC: Recent Labs    05/15/21 1605 02/17/22 1649 02/26/22 1820  WBC 6.1 7.2 14.0*  HGB 9.5* 10.2* 9.2*  HCT 32.8* 32.6* 30.8*  PLT 480* 487* 575*    COAGS: No results for input(s): "INR", "APTT" in the last 8760 hours.  BMP: Recent Labs    02/26/22 1820  NA 133*  K 3.5  CL 103  CO2 21*  GLUCOSE 115*  BUN 7  CALCIUM 8.6*  CREATININE 0.60  GFRNONAA >60    LIVER FUNCTION TESTS: Recent Labs    02/26/22 1820  BILITOT 0.6  AST 27  ALT 23  ALKPHOS 70  PROT 7.8  ALBUMIN 4.1    TUMOR MARKERS: No results for input(s): "AFPTM", "CEA", "CA199", "CHROMGRNA" in the last 8760 hours.  Assessment and Plan:  Very pleasant 47 year old female with large and highly symptomatic uterine fibroids.  On MR imaging, the majority of the fibroids have a relatively normal appearance with normal tissue enhancement.  Several demonstrate findings consistent with prior infarction and are nonviable.  However, there is a large and relatively heterogeneous fibroid which could represent either an actively infarcted and degenerating fibroid, or potentially a leiomyosarcoma.  Unfortunately, imaging alone is insufficient to differentiate the two.  I discussed in detail with Mrs. Ames the ramifications of these imaging findings.  While statistically it is most likely that this represents an actively degenerating fibroid, particularly  given her recent episode of acute onset left-sided pelvic pain, leukocytosis and other symptoms this past January, a leiomyosarcoma remains a possibility as these can also partially infarct while they are rapidly enlarging.  While uterine artery embolization is an extremely effective therapy to treat symptomatic uterine fibroids, the fact that we do not remove any tissue means we do not get the benefit of analyzing for cancer cells while a hysterectomy achieves both a very effective treatment as well as removing the tissue to provide a diagnosis.  I advised her to return to Dr. Kennon Rounds to discuss these MRI results and to strongly consider hysterectomy.  If she decides that she absolutely does not want to have a hysterectomy, I would consider proceeding with [place] but we would need to pursue follow-up MRI imaging to evaluate for residual enhancement and growth of the abnormal fibroid.  If it became clear that the embolization was not effective and that 1 particular lesion was continuing to enlarge then the risk of leiomyosarcoma would be much higher and she would ultimately require hysterectomy.  She voiced her understanding and will follow-up with Dr. Kennon Rounds.  She would like to think about things.  She has our contact info and will reach out to [place] if she would like to discuss further.    Thank you for this interesting consult.  I greatly enjoyed meeting [facility] and look forward to participating in their care.  A copy of this report was sent to the requesting provider on this date.  Electronically Signed: Criselda Peaches 04/21/2022, 1:31 PM   I spent a total of 40 Minutes  in face to face in clinical consultation, greater than 50% of which was counseling/coordinating care for uterine fibroids.

## 2022-05-05 ENCOUNTER — Encounter: Payer: Self-pay | Admitting: Obstetrics & Gynecology

## 2022-05-05 ENCOUNTER — Ambulatory Visit: Payer: No Typology Code available for payment source | Admitting: Obstetrics & Gynecology

## 2022-05-05 VITALS — BP 128/80 | HR 76 | Wt 156.0 lb

## 2022-05-05 DIAGNOSIS — Z1231 Encounter for screening mammogram for malignant neoplasm of breast: Secondary | ICD-10-CM | POA: Diagnosis not present

## 2022-05-05 DIAGNOSIS — D219 Benign neoplasm of connective and other soft tissue, unspecified: Secondary | ICD-10-CM

## 2022-05-05 DIAGNOSIS — N939 Abnormal uterine and vaginal bleeding, unspecified: Secondary | ICD-10-CM

## 2022-05-05 MED ORDER — MEGESTROL ACETATE 40 MG PO TABS: 40.0000 mg | ORAL_TABLET | Freq: Three times a day (TID) | ORAL | 5 refills | Status: DC

## 2022-05-05 NOTE — Progress Notes (Unsigned)
GYNECOLOGY OFFICE VISIT NOTE  History:   Kellie Cowan is a 47 y.o. [phone] here today for discusison about management of her symptomatic uterine fibroids and abnormal bleeding.  Was recently seen by Interventional Radiology on 04/21/2022, there was extensive counseling about concern for the biggest fibroid and inability to rule out leiomyosarcoma.  She was told to consider hysterectomy instead, but uterine artery embolization would be considered if she declined hysterectomy. Please refer to Dr. Katrinka Blazing note on 04/21/2022.  Patient still has AUB, decreased on Megace 80 mg po bid but not adequately controlled. She denies any abnormal vaginal discharge, pelvic pain or other concerns.    Past Medical History:  Diagnosis Date   Asthma    Cervical dysplasia    s/p LEEP in 2004   Esophageal stricture    s/p dilation x 2    Past Surgical History:  Procedure Laterality Date   BREAST REDUCTION SURGERY  2000   BREAST REDUCTION SURGERY Bilateral 2000   ESOPHAGEAL DILATION     X2 for stricture   IR RADIOLOGIST EVAL & MGMT  04/21/2022   LEEP  2004   TUBAL LIGATION  2007    The following portions of the patient's history were reviewed and updated as appropriate: allergies, current medications, past family history, past medical history, past social history, past surgical history and problem list.   Health Maintenance:  Normal pap and negative HRHPV on 05/15/2021.  Normal mammogram on 06/25/2021.   Review of Systems:  Pertinent items noted in HPI and remainder of comprehensive ROS otherwise negative.  Physical Exam:  BP 128/80   Pulse 76   Wt 156 lb (70.8 kg)   LMP 04/24/2022 (Approximate)   BMI 28.53 kg/m  CONSTITUTIONAL: Well-developed, well-nourished female in no acute distress.  HEENT:  Normocephalic, atraumatic. External right and left ear normal. No scleral icterus.  NECK: Normal range of motion, supple, no masses noted on observation SKIN: No rash noted. Not diaphoretic. No  erythema. No pallor. MUSCULOSKELETAL: Normal range of motion. No edema noted. NEUROLOGIC: Alert and oriented to person, place, and time. Normal muscle tone coordination. No cranial nerve deficit noted. PSYCHIATRIC: Normal mood and affect. Normal behavior. Normal judgment and thought content. CARDIOVASCULAR: Normal heart rate noted RESPIRATORY: Effort and breath sounds normal, no problems with respiration noted ABDOMEN: Large fibroid uterus.  No other overt distention noted.   PELVIC: Deferred  Labs and Imaging IR Radiologist Eval & Mgmt  Result Date: 04/21/2022 EXAM: NEW PATIENT OFFICE VISIT CHIEF COMPLAINT: SEE EPIC NOTE HISTORY OF PRESENT ILLNESS: SEE EPIC NOTE REVIEW OF SYSTEMS: SEE EPIC NOTE PHYSICAL EXAMINATION: SEE EPIC NOTE ASSESSMENT AND PLAN: SEE EPIC NOTE Electronically Signed   By: Jacqulynn Cadet M.D.   On: 04/21/2022 13:05   MR PELVIS W WO CONTRAST  Result Date: 04/20/2022 CLINICAL DATA:  History of multiple uterine fibroids having [place] consult tomorrow. EXAM: MRI PELVIS WITHOUT AND WITH CONTRAST TECHNIQUE: Multiplanar multisequence MR imaging of the pelvis was performed both before and after administration of intravenous contrast. CONTRAST:  7.5 cc of Vueway COMPARISON:  CT February 26, 2022. FINDINGS: Urinary Tract: No acute abnormality. Bowel: Unremarkable pelvic bowel loops. Vascular/Lymphatic: No pathologically enlarged lymph nodes or other significant abnormality. Reproductive: Uterus: Measures 13.1 x 9.6 x 17.3 cm. Heterogeneous intramural lesion in the lower uterine segment measuring 7.3 x 6.5 x 9.8 cm on images 13/3 and 20/5 demonstrates central area which is T2 hyperintense T1 mildly hyperintense with discontinuous enhancing peripheral nodularity that is T2 hypointense. Additional  uterine lesions of varying degrees of complexity including an enhancing submucosal lesion in the mid uterine segment measuring 4.4 x 2.7 x 2.0 cm on images 45/23 and 19/101 and a submucosal lesion  demonstrating similar imaging characteristics measuring 4.2 x 3.1 cm on image 12/101. Right ovary: Appears normal. No mass identified. Left ovary:  Appears normal. No mass identified. Other: No significant pelvic free fluid. Musculoskeletal:  No aggressive osseous lesion. 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 Of note, strictly speaking while not favored, given the degree of lesional complexity and size, a leiomyosarcoma can not be excluded on imaging alone. Electronically Signed   By: Dahlia Bailiff M.D.   On: 04/20/2022 15:48      Assessment and Plan:     1. Breast cancer screening by mammogram Mammogram scheduled - MM 3D SCREENING MAMMOGRAM BILATERAL BREAST; Future  2. Fibroids 3. Abnormal uterine bleeding (AUB) Reviewed MRI, compared recent MRI and ultrasound measurements to the ultrasound done last year, all fibroids increased by about 1 cm.  Low suspicion for leiomyosarcoma, but patient was told the only way to ensure this was benign was via pathology analysis. Patient really wants to avoid hysterectomy, but did discuss that she can be a candidate for total abdominal hysterectomy if desired. Discussed risks of hysterectomy including but not limited to: bleeding which may require transfusion or reoperation; infection which may require antibiotics; injury to bowel, bladder, ureters or other surrounding organs; need for additional procedures including laparotomy or subsequent procedures secondary to abnormal pathology; formation of adhesions; thromboembolic phenomenon; incisional problems and other postoperative/anesthesia complications.  Patient was also advised that she will remain in house for 2 nights; and expected recovery time after a hysterectomy is 6-8  weeks.  Patient was told that the likelihood that her condition and symptoms will be treated effectively with this surgical management was very high; the postoperative expectations were also discussed in detail.  Printed patient education handouts about hysterectomy was given to the patient to review at home. She is considering this, but really wants to try UFE as a minimally invasive option first.  I sent a message to Dr. Laurence Ferrari (IR) inquiring about how to arrange for this and asked if another referral is needed. She understands that follow up MRI will be needed if she undergoes UFE. For now, increased her Megace dosage to control her bleeding, will monitor response. - megestrol (MEGACE) 40 MG tablet; Take 1 tablet (40 mg total) by mouth in the morning, at noon, and at bedtime. Can decrease to 40 mg daily once bleeding stops  Dispense: 180 tablet; Refill: 5  Routine preventative health maintenance measures emphasized. Please refer to After Visit Summary for other counseling recommendations.   Return for any gynecologic concerns.    I spent 30 minutes dedicated to the care of this patient including pre-visit review of records, face to face time with the patient discussing her conditions and treatments and post visit orders.    Verita Schneiders, MD, [place] for [employer], [place]

## 2022-05-06 ENCOUNTER — Encounter: Payer: Self-pay | Admitting: Obstetrics & Gynecology

## 2022-05-06 NOTE — Progress Notes (Signed)
Reply received from Dr. Laurence Ferrari, the office will contact patient and make appointment.  Appreciate their expertise and recommendations.   Verita Schneiders, MD

## 2022-05-19 ENCOUNTER — Other Ambulatory Visit: Payer: Self-pay | Admitting: Interventional Radiology

## 2022-05-19 DIAGNOSIS — D250 Submucous leiomyoma of uterus: Secondary | ICD-10-CM

## 2022-06-29 ENCOUNTER — Ambulatory Visit
Admission: RE | Admit: 2022-06-29 | Discharge: 2022-06-29 | Disposition: A | Discharge status: Home / Self Care | Payer: No Typology Code available for payment source | Source: Ambulatory Visit | Attending: Obstetrics & Gynecology | Admitting: Obstetrics & Gynecology

## 2022-06-29 DIAGNOSIS — Z1231 Encounter for screening mammogram for malignant neoplasm of breast: Secondary | ICD-10-CM | POA: Diagnosis present

## 2022-09-09 ENCOUNTER — Other Ambulatory Visit: Payer: Self-pay | Admitting: Interventional Radiology

## 2022-09-09 DIAGNOSIS — D250 Submucous leiomyoma of uterus: Secondary | ICD-10-CM

## 2023-04-06 ENCOUNTER — Ambulatory Visit: Payer: Medicaid Other | Admitting: Obstetrics and Gynecology

## 2023-04-06 VITALS — BP 152/90 | HR 86 | Wt 158.0 lb

## 2023-04-06 DIAGNOSIS — N898 Other specified noninflammatory disorders of vagina: Secondary | ICD-10-CM | POA: Diagnosis not present

## 2023-04-06 DIAGNOSIS — M2550 Pain in unspecified joint: Secondary | ICD-10-CM | POA: Diagnosis not present

## 2023-04-06 DIAGNOSIS — N951 Menopausal and female climacteric states: Secondary | ICD-10-CM

## 2023-04-06 NOTE — Progress Notes (Signed)
 CC: Discuss iron infusions and menopause.    Night sweats terrible  Joint pain also bad- Menopause ?  RA in family history  New to medicaid and we were placed as her PCP   Mood swings- crying multiple times a day   High blood pressure over the last 2 months   Recently with through divorce   Clinical trial with [facility] esophagus   Check on PA for Megace

## 2023-04-07 LAB — COMPREHENSIVE METABOLIC PANEL
ALT: 27 [IU]/L (ref 0–32)
AST: 25 [IU]/L (ref 0–40)
Albumin: 4.7 g/dL (ref 3.9–4.9)
Alkaline Phosphatase: 120 [IU]/L (ref 44–121)
BUN/Creatinine Ratio: 20 (ref 9–23)
BUN: 14 mg/dL (ref 6–24)
Bilirubin Total: 0.4 mg/dL (ref 0.0–1.2)
CO2: 17 mmol/L — ABNORMAL LOW (ref 20–29)
Calcium: 10 mg/dL (ref 8.7–10.2)
Chloride: 103 mmol/L (ref 96–106)
Creatinine, Ser: 0.71 mg/dL (ref 0.57–1.00)
Globulin, Total: 2.7 g/dL (ref 1.5–4.5)
Glucose: 104 mg/dL — ABNORMAL HIGH (ref 70–99)
Potassium: 4 mmol/L (ref 3.5–5.2)
Sodium: 138 mmol/L (ref 134–144)
Total Protein: 7.4 g/dL (ref 6.0–8.5)
eGFR: 104 mL/min/{1.73_m2} (ref >59)

## 2023-04-07 LAB — CBC
Hematocrit: 37.8 % (ref 34.0–46.6)
Hemoglobin: 11.9 g/dL (ref 11.1–15.9)
MCH: 22.8 pg — ABNORMAL LOW (ref 26.6–33.0)
MCHC: 31.5 g/dL (ref 31.5–35.7)
MCV: 73 fL — ABNORMAL LOW (ref 79–97)
Platelets: 505 10*3/uL — ABNORMAL HIGH (ref 150–450)
RBC: 5.21 x10E6/uL (ref 3.77–5.28)
RDW: 17.8 % — ABNORMAL HIGH (ref 11.7–15.4)
WBC: 6.6 10*3/uL (ref 3.4–10.8)

## 2023-04-07 LAB — HEMOGLOBIN A1C
Est. average glucose Bld gHb Est-mCnc: 232 mg/dL
Hgb A1c MFr Bld: 9.7 % — ABNORMAL HIGH (ref 4.8–5.6)

## 2023-04-07 LAB — TSH RFX ON ABNORMAL TO FREE T4: TSH: 1.32 u[IU]/mL (ref 0.450–4.500)

## 2023-04-08 DIAGNOSIS — M2550 Pain in unspecified joint: Secondary | ICD-10-CM | POA: Insufficient documentation

## 2023-04-08 NOTE — Progress Notes (Signed)
 Obstetrics and Gynecology Visit Return Patient Evaluation  Appointment Date: 04/06/2023  Primary Care Provider: None  OBGYN Clinic: [facility] Complaint: hot flashes, night sweats  History of Present Illness:  Kellie Cowan is a 48 y.o. with above chief complaint. Patient followed by Dr. Macon Large for heavy bleeding and fibroids, last seen March 2024. Patient currently on megace 40 qday and no AUB; she does not increase her megace during a period.   Patient here today for 1-2 month history of hot flashes, night sweats and 1 month h/o arthralgias in upper and lower extremity joint pain; no prior h/o s/s, no recent GI or URI illness  04/2021 pap and hpv negative  Review of Systems: as noted in the History of Present Illness.  Patient Active Problem List   Diagnosis Date Noted   Arthralgia 04/08/2023   Fever 03/06/2022   Fibroids 02/17/2022   Abnormal uterine bleeding (AUB) 02/17/2022   Iron deficiency anemia due to chronic blood loss 05/19/2021   EE (eosinophilic esophagitis) 11/05/2016   Esophageal stricture 09/17/2016   Medications:  Forbes Cellar had no medications administered during this visit. Current Outpatient Medications  Medication Sig Dispense Refill   ferrous gluconate (FERGON) 324 MG tablet Take 324 mg by mouth every morning.     ferrous sulfate 325 (65 FE) MG tablet Take by mouth.     megestrol (MEGACE) 40 MG tablet Take 1 tablet (40 mg total) by mouth in the morning, at noon, and at bedtime. Can decrease to 40 mg daily once bleeding stops 180 tablet 5   Multiple Vitamin (MULTIVITAMIN) tablet Take 1 tablet by mouth daily.     No current facility-administered medications for this visit.    Allergies: has no known allergies.  Physical Exam:  BP (!) 152/90   Pulse 86   Wt 158 lb (71.7 kg)   BMI 28.90 kg/m  Body mass index is 28.9 kg/m. General appearance: Well nourished, well developed female in no acute distress.   Abdomen: diffusely non tender to palpation, non distended, and no masses, hernias Neuro/Psych:  Normal mood and affect.    Assessment: patient stable  Plan:  1. Menopausal symptom (Primary) D/w her that s/s are consistent with likely going through menopause/being in peri-menopause. Will check basic labs today. I d/w her re: potential options that can help. I told her that I don't recommend estrogen therapy, given her uncontrolled HTN history and can potentially consider transdermal HRT if has controlled BP for several months. In addition to HTN risk, I also d/w her re: other risks and side effects with HRT.  I also told her that non-hormonal options have side effects associated with it, such as mood, sleepiness, etc, and are usually off-label with paxil being an exception.  - TSH Rfx on Abnormal to Free T4 - CBC - Hemoglobin A1c - Comprehensive metabolic panel  2. Arthralgia, unspecified joint D/w her need for PCP. I showed her on the [facility] homepage how to make an appointment with MyChart  3. Vaginal odor Declines swab; d/w her that menopause can increase risk of bv and/or yeast   RTC: PRN  Cornelia Copa MD Attending Center for [facility])

## 2023-04-09 ENCOUNTER — Encounter: Payer: Self-pay | Admitting: Obstetrics and Gynecology

## 2023-04-09 ENCOUNTER — Other Ambulatory Visit: Payer: Self-pay | Admitting: Obstetrics and Gynecology

## 2023-04-09 DIAGNOSIS — E119 Type 2 diabetes mellitus without complications: Secondary | ICD-10-CM | POA: Insufficient documentation

## 2023-04-09 DIAGNOSIS — D75839 Thrombocytosis, unspecified: Secondary | ICD-10-CM | POA: Insufficient documentation

## 2023-04-09 DIAGNOSIS — D219 Benign neoplasm of connective and other soft tissue, unspecified: Secondary | ICD-10-CM

## 2023-04-09 DIAGNOSIS — N939 Abnormal uterine and vaginal bleeding, unspecified: Secondary | ICD-10-CM

## 2023-04-09 MED ORDER — MEGESTROL ACETATE 40 MG PO TABS: 40.0000 mg | ORAL_TABLET | Freq: Every day | ORAL | 5 refills | Status: AC

## 2023-04-13 ENCOUNTER — Ambulatory Visit: Payer: Medicaid Other | Admitting: Physician Assistant

## 2023-04-13 ENCOUNTER — Encounter: Payer: Self-pay | Admitting: Physician Assistant

## 2023-04-13 VITALS — BP 138/78 | HR 85 | Temp 98.7°F | Ht 62.0 in | Wt 158.0 lb

## 2023-04-13 DIAGNOSIS — Z7984 Long term (current) use of oral hypoglycemic drugs: Secondary | ICD-10-CM | POA: Diagnosis not present

## 2023-04-13 DIAGNOSIS — Z8261 Family history of arthritis: Secondary | ICD-10-CM | POA: Diagnosis not present

## 2023-04-13 DIAGNOSIS — E1169 Type 2 diabetes mellitus with other specified complication: Secondary | ICD-10-CM | POA: Diagnosis not present

## 2023-04-13 DIAGNOSIS — I10 Essential (primary) hypertension: Secondary | ICD-10-CM

## 2023-04-13 DIAGNOSIS — R739 Hyperglycemia, unspecified: Secondary | ICD-10-CM

## 2023-04-13 DIAGNOSIS — M2550 Pain in unspecified joint: Secondary | ICD-10-CM

## 2023-04-13 DIAGNOSIS — D500 Iron deficiency anemia secondary to blood loss (chronic): Secondary | ICD-10-CM | POA: Diagnosis not present

## 2023-04-13 LAB — POCT GLYCOSYLATED HEMOGLOBIN (HGB A1C): Hemoglobin A1C: 8.8 % — AB (ref 4.0–5.6)

## 2023-04-13 MED ORDER — METFORMIN HCL 500 MG PO TABS: ORAL_TABLET | ORAL | 1 refills | Status: DC

## 2023-04-13 NOTE — Progress Notes (Signed)
 Date:  04/13/2023   Name:  Kellie Cowan   DOB:  1975-07-14   MRN:  [id number]   Chief Complaint: Establish Care, Diabetes (A1C 9.7, thirsty, urine smells sweet, joint pain in hands, dark signs around neck ), and Hypertension (BP elevated )  HPI Kellie Cowan is a very pleasant 48 year old female with a history of iron deficiency anemia and HTN who presents new to the clinic today to establish care.  She feels that her health has really taken a turn recently, citing joint pains, poor sleep, and pronounced fatigue. Some of these symptoms likely from perimenopause per GYN.   Curiously, labs from GYN 1 week ago revealed an A1c of 9.7% despite having normal glucose readings on previous metabolic panels, never higher than 115 mg/dL. She does have family history of diabetes in her father.   Those labs also showed borderline anemia with Hgb 11.9 but MCV 73 and MCH 22.8 in this patient with known iron deficiency anemia due to chronic blood loss (abnormal uterine bleeding) previously requiring iron infusions in Jan and Feb 2024. She is not currently supplementing with iron due to poor tolerance (constipation).   She has been under a great deal of stress recently, going through a difficult divorce. Nutrition suffering somewhat.   Currently on leave from work. Planning to return to work within the month.    Medication list has been reviewed and updated.  Current Meds  Medication Sig   megestrol (MEGACE) 40 MG tablet Take 1 tablet (40 mg total) by mouth daily.   metFORMIN (GLUCOPHAGE) 500 MG tablet Take 1 tablet (500 mg total) by mouth daily with breakfast for 7 days, THEN 1 tablet (500 mg total) 2 (two) times daily with a meal.   [DISCONTINUED] ferrous gluconate (FERGON) 324 MG tablet Take 324 mg by mouth every morning.     Review of Systems  Patient Active Problem List   Diagnosis Date Noted   DM (diabetes mellitus), type 2 (HCC) 04/09/2023   Thrombocytosis 04/09/2023   Arthralgia 04/08/2023    Fibroids 02/17/2022   EE (eosinophilic esophagitis) 11/05/2016   Esophageal stricture 09/17/2016    No Known Allergies  Immunization History  Administered Date(s) Administered   Influenza,inj,Quad PF,6+ Mos 11/24/2016   PPD Test 11/24/2016    Past Surgical History:  Procedure Laterality Date   BREAST REDUCTION SURGERY Bilateral 2000   ESOPHAGEAL DILATION     X2 for stricture   IR RADIOLOGIST EVAL & MGMT  04/21/2022   LEEP  2004   REDUCTION MAMMAPLASTY     TUBAL LIGATION  2007    Social History   Tobacco Use   Smoking status: Never   Smokeless tobacco: Never  Vaping Use   Vaping status: Never Used  Substance Use Topics   Alcohol use: Not Currently   Drug use: Never    Family History  Problem Relation Age of Onset   Rheum arthritis Mother    Asthma Mother    Hypertension Father    Diabetes Father    Rheum arthritis Maternal Aunt         04/13/2023    9:36 AM  GAD 7 : Generalized Anxiety Score  Nervous, Anxious, on Edge 0  Control/stop worrying 1  Worry too much - different things 0  Trouble relaxing 0  Restless 0  Easily annoyed or irritable 0  Afraid - awful might happen 0  Total GAD 7 Score 1  Anxiety Difficulty Not difficult at all  04/13/2023    9:36 AM 03/06/2022   10:01 AM  Depression screen PHQ 2/9  Decreased Interest 0 0  Down, Depressed, Hopeless 0 0  PHQ - 2 Score 0 0    BP Readings from Last 3 Encounters:  04/13/23 138/78  04/06/23 (!) 152/90  05/05/22 128/80    Wt Readings from Last 3 Encounters:  04/13/23 158 lb (71.7 kg)  04/06/23 158 lb (71.7 kg)  05/05/22 156 lb (70.8 kg)    BP 138/78 (Cuff Size: Normal)   Pulse 85   Temp 98.7 F (37.1 C)   Ht 5\' 2"  (1.575 m)   Wt 158 lb (71.7 kg)   SpO2 99%   BMI 28.90 kg/m   Physical Exam Vitals and nursing note reviewed.  Constitutional:      Appearance: Normal appearance.  Cardiovascular:     Rate and Rhythm: Normal rate and regular rhythm.     Heart sounds: No  murmur heard.    No friction rub. No gallop.  Pulmonary:     Effort: Pulmonary effort is normal.     Breath sounds: Normal breath sounds.  Abdominal:     General: There is no distension.  Musculoskeletal:        General: Normal range of motion.     Comments: Normal appearance of hands, though she does have some point tenderness at the left first MCP and left third MCP.  Preserved ROM in both knees which are without edema or tenderness.  Normal gait  Skin:    General: Skin is warm and dry.  Neurological:     Mental Status: She is alert and oriented to person, place, and time.     Gait: Gait is intact.  Psychiatric:        Mood and Affect: Mood and affect normal.     Recent Labs     Component Value Date/Time   NA 138 04/06/2023 1602   K 4.0 04/06/2023 1602   CL 103 04/06/2023 1602   CO2 17 (L) 04/06/2023 1602   GLUCOSE 104 (H) 04/06/2023 1602   GLUCOSE 115 (H) 02/26/2022 1820   BUN 14 04/06/2023 1602   CREATININE 0.71 04/06/2023 1602   CALCIUM 10.0 04/06/2023 1602   PROT 7.4 04/06/2023 1602   ALBUMIN 4.7 04/06/2023 1602   AST 25 04/06/2023 1602   ALT 27 04/06/2023 1602   ALKPHOS 120 04/06/2023 1602   BILITOT 0.4 04/06/2023 1602   GFRNONAA >60 02/26/2022 1820    Lab Results  Component Value Date   WBC 6.6 04/06/2023   HGB 11.9 04/06/2023   HCT 37.8 04/06/2023   MCV 73 (L) 04/06/2023   PLT 505 (H) 04/06/2023   Lab Results  Component Value Date   HGBA1C 9.7 (H) 04/06/2023   No results found for: "CHOL", "HDL", "LDLCALC", "LDLDIRECT", "TRIG", "CHOLHDL" Lab Results  Component Value Date   TSH 1.320 04/06/2023     Assessment and Plan:  1. Type 2 diabetes mellitus with other specified complication, without long-term current use of insulin (HCC) (Primary) Rechecked POC A1c which was 8.8% today, confirming diabetes diagnosis.  Will check lipids and gad 65 antibodies in this unusual presentation of diabetes family history of autoimmunity.  Begin low-dose metformin as  below.  Recommend diet low in simple carbohydrates such as white starches (bread, pasta, rice) and refined sugar found in desserts and sweetened beverages including juice, sweet tea, and soda.   - POCT HgB A1C - Lipid panel - Glutamic acid decarboxylase auto abs -  metFORMIN (GLUCOPHAGE) 500 MG tablet; Take 1 tablet (500 mg total) by mouth daily with breakfast for 7 days, THEN 1 tablet (500 mg total) 2 (two) times daily with a meal.  Dispense: 90 tablet; Refill: 1  2. Hyperglycemia Plan as above  3. Polyarthralgia - Rheumatoid factor - CYCLIC CITRUL PEPTIDE ANTIBODY, IGG/IGA - Sedimentation rate - C-reactive protein - ANA w/Reflex if Positive  4. Family history of rheumatoid arthritis - Rheumatoid factor - CYCLIC CITRUL PEPTIDE ANTIBODY, IGG/IGA - Sedimentation rate - C-reactive protein  5. Iron deficiency anemia due to chronic blood loss Check iron labs today.  Resume iron supplementation.  She may consider taking iron every other day, or switching to iron glycinate which is typically better tolerated. - Iron, TIBC and Ferritin Panel  6. Hypertension, unspecified type May be stress related.  Advised she purchase a home blood pressure cuff and keep a log for my review next time.  Will reconsider pharmacotherapy at that time.  Consider the "Seven S's" of hypertension including salt, smoking, stimulants (e.g. caffeine), stress, sleep, snoring (OSA), sedentary lifestyle.    Return in about 2 weeks (around 04/27/2023) for OV f/u chronic conditions.    Alvester Morin, PA-C, DMSc, Nutritionist [facility] [phone]

## 2023-04-13 NOTE — Patient Instructions (Addendum)
-  It was a pleasure to see you today! Please review your visit summary for helpful information -Lab results are usually available within 1-2 days and we will call once reviewed -I would encourage you to follow your care via MyChart where you can access lab results, notes, messages, and more -If you feel that we did a nice job today, please complete your after-visit survey and leave us a Google review! Your CMA today was Kieandra and your provider was Dan Waddell, PA-C, DMSc -Please return for follow-up in about 2 weeks 

## 2023-04-14 DIAGNOSIS — H5213 Myopia, bilateral: Secondary | ICD-10-CM | POA: Diagnosis not present

## 2023-04-14 DIAGNOSIS — H524 Presbyopia: Secondary | ICD-10-CM | POA: Diagnosis not present

## 2023-04-14 DIAGNOSIS — H52223 Regular astigmatism, bilateral: Secondary | ICD-10-CM | POA: Diagnosis not present

## 2023-04-14 LAB — OPHTHALMOLOGY REPORT-SCANNED

## 2023-04-15 LAB — LIPID PANEL
Chol/HDL Ratio: 3.5 ratio (ref 0.0–4.4)
Cholesterol, Total: 134 mg/dL (ref 100–199)
HDL: 38 mg/dL — ABNORMAL LOW (ref >39)
LDL Chol Calc (NIH): 85 mg/dL (ref 0–99)
Triglycerides: 51 mg/dL (ref 0–149)
VLDL Cholesterol Cal: 11 mg/dL (ref 5–40)

## 2023-04-15 LAB — IRON,TIBC AND FERRITIN PANEL
Ferritin: 6 ng/mL — ABNORMAL LOW (ref 15–150)
Iron Saturation: 5 % — CL (ref 15–55)
Iron: 22 ug/dL — ABNORMAL LOW (ref 27–159)
Total Iron Binding Capacity: 469 ug/dL — ABNORMAL HIGH (ref 250–450)
UIBC: 447 ug/dL — ABNORMAL HIGH (ref 131–425)

## 2023-04-15 LAB — C-REACTIVE PROTEIN: CRP: <1 mg/L (ref 0–10)

## 2023-04-15 LAB — GLUTAMIC ACID DECARBOXYLASE AUTO ABS: Glutamic Acid Decarb Ab: <5 U/mL (ref 0.0–5.0)

## 2023-04-15 LAB — RHEUMATOID FACTOR: Rheumatoid fact SerPl-aCnc: <10 [IU]/mL (ref <14.0)

## 2023-04-15 LAB — ANA W/REFLEX IF POSITIVE: Anti Nuclear Antibody (ANA): NEGATIVE

## 2023-04-15 LAB — SEDIMENTATION RATE: Sed Rate: 5 mm/h (ref 0–32)

## 2023-04-15 LAB — CYCLIC CITRUL PEPTIDE ANTIBODY, IGG/IGA: Cyclic Citrullin Peptide Ab: 5 U (ref 0–19)

## 2023-04-16 DIAGNOSIS — H5213 Myopia, bilateral: Secondary | ICD-10-CM | POA: Diagnosis not present

## 2023-04-16 NOTE — Telephone Encounter (Signed)
 Please review.  KP

## 2023-04-27 ENCOUNTER — Encounter: Payer: Self-pay | Admitting: Physician Assistant

## 2023-04-27 ENCOUNTER — Ambulatory Visit: Payer: Medicaid Other | Admitting: Physician Assistant

## 2023-04-27 VITALS — BP 130/76 | HR 73 | Temp 98.4°F | Ht 62.0 in | Wt 155.0 lb

## 2023-04-27 DIAGNOSIS — E1169 Type 2 diabetes mellitus with other specified complication: Secondary | ICD-10-CM | POA: Diagnosis not present

## 2023-04-27 DIAGNOSIS — R030 Elevated blood-pressure reading, without diagnosis of hypertension: Secondary | ICD-10-CM

## 2023-04-27 DIAGNOSIS — D500 Iron deficiency anemia secondary to blood loss (chronic): Secondary | ICD-10-CM | POA: Diagnosis not present

## 2023-04-27 MED ORDER — GLIMEPIRIDE 1 MG PO TABS: 1.0000 mg | ORAL_TABLET | Freq: Every day | ORAL | 1 refills | Status: DC

## 2023-04-27 NOTE — Progress Notes (Signed)
 Date:  04/27/2023   Name:  Kellie Cowan   DOB:  [birth year]   MRN:  [id number]   Chief Complaint: No chief complaint on file.  HPI Kellie Cowan presents for 2-week follow-up on chronic conditions after establishing care with me 04/13/2023.  Last visit I encouraged her to start iron glycinate for iron deficiency anemia, which she is taking with good compliance and tolerance.  She has noticed some increase in her energy.    We also started metformin for new diabetes diagnosis with most recent A1c 8.8%. She took it for 6 days but was not able to tolerate due to diarrhea which she felt inhibited her from leaving the house.  That said, she is very determined to improve glycemic control through lifestyle measures.  She has improved her diet and physical activity, and intends to continue to do so.  Last visit we performed rheumatoid panel and GAD-65 antibodies given her polyarthralgia and family history of autoimmunity, but all of this testing was negative.   Regarding her blood pressure, she remains normotensive outside of this office.  She is currently participating in a clinical trial for esophageal stricture, and reports blood pressures are typically normal when she goes for those visits, which I confirm on epic.  She also checks her blood pressure whenever she goes to [employer], states normal then too.   Medication list has been reviewed and updated.  Current Meds  Medication Sig   Ferrous Sulfate (IRON PO) Take by mouth.   glimepiride (AMARYL) 1 MG tablet Take 1 tablet (1 mg total) by mouth daily with breakfast.   megestrol (MEGACE) 40 MG tablet Take 1 tablet (40 mg total) by mouth daily.   [DISCONTINUED] metFORMIN (GLUCOPHAGE) 500 MG tablet Take 1 tablet (500 mg total) by mouth daily with breakfast for 7 days, THEN 1 tablet (500 mg total) 2 (two) times daily with a meal.     Review of Systems  Patient Active Problem List   Diagnosis Date Noted   Family history of rheumatoid arthritis  04/13/2023   Hypertension 04/13/2023   DM (diabetes mellitus), type 2 (HCC) 04/09/2023   Thrombocytosis 04/09/2023   Arthralgia 04/08/2023   Fibroids 02/17/2022   Iron deficiency anemia due to chronic blood loss 05/19/2021   EE (eosinophilic esophagitis) 11/05/2016   Esophageal stricture 09/17/2016    Allergies  Allergen Reactions   Metformin And Related Diarrhea    Immunization History  Administered Date(s) Administered   Influenza,inj,Quad PF,6+ Mos 11/24/2016   PPD Test 11/24/2016    Past Surgical History:  Procedure Laterality Date   BREAST REDUCTION SURGERY Bilateral 2000   ESOPHAGEAL DILATION     X2 for stricture   IR RADIOLOGIST EVAL & MGMT  04/21/2022   LEEP  2004   REDUCTION MAMMAPLASTY     TUBAL LIGATION  2007    Social History   Tobacco Use   Smoking status: Never   Smokeless tobacco: Never  Vaping Use   Vaping status: Never Used  Substance Use Topics   Alcohol use: Not Currently   Drug use: Never    Family History  Problem Relation Age of Onset   Rheum arthritis Mother    Asthma Mother    Hypertension Father    Diabetes Father    Rheum arthritis Maternal Aunt         04/27/2023   10:07 AM 04/13/2023    9:36 AM  GAD 7 : Generalized Anxiety Score  Nervous, Anxious, on Edge 0 0  Control/stop worrying 1 1  Worry too much - different things 0 0  Trouble relaxing 0 0  Restless 0 0  Easily annoyed or irritable 0 0  Afraid - awful might happen 0 0  Total GAD 7 Score 1 1  Anxiety Difficulty Not difficult at all Not difficult at all       04/27/2023   10:07 AM 04/13/2023    9:36 AM 03/06/2022   10:01 AM  Depression screen PHQ 2/9  Decreased Interest 0 0 0  Down, Depressed, Hopeless 0 0 0  PHQ - 2 Score 0 0 0    BP Readings from Last 3 Encounters:  04/27/23 130/76  04/13/23 138/78  04/06/23 (!) 152/90    Wt Readings from Last 3 Encounters:  04/27/23 155 lb (70.3 kg)  04/13/23 158 lb (71.7 kg)  04/06/23 158 lb (71.7 kg)    BP  130/76 (Cuff Size: Normal)   Pulse 73   Temp 98.4 F (36.9 C)   Ht 5\' 2"  (1.575 m)   Wt 155 lb (70.3 kg)   SpO2 96%   BMI 28.35 kg/m   Physical Exam Vitals and nursing note reviewed.  Constitutional:      Appearance: Normal appearance.  Cardiovascular:     Rate and Rhythm: Normal rate.  Pulmonary:     Effort: Pulmonary effort is normal.  Abdominal:     General: There is no distension.  Musculoskeletal:        General: Normal range of motion.  Skin:    General: Skin is warm and dry.  Neurological:     Mental Status: She is alert and oriented to person, place, and time.     Gait: Gait is intact.  Psychiatric:        Mood and Affect: Mood and affect normal.     Recent Labs     Component Value Date/Time   NA 138 04/06/2023 1602   K 4.0 04/06/2023 1602   CL 103 04/06/2023 1602   CO2 17 (L) 04/06/2023 1602   GLUCOSE 104 (H) 04/06/2023 1602   GLUCOSE 115 (H) 02/26/2022 1820   BUN 14 04/06/2023 1602   CREATININE 0.71 04/06/2023 1602   CALCIUM 10.0 04/06/2023 1602   PROT 7.4 04/06/2023 1602   ALBUMIN 4.7 04/06/2023 1602   AST 25 04/06/2023 1602   ALT 27 04/06/2023 1602   ALKPHOS 120 04/06/2023 1602   BILITOT 0.4 04/06/2023 1602   GFRNONAA >60 02/26/2022 1820    Lab Results  Component Value Date   WBC 6.6 04/06/2023   HGB 11.9 04/06/2023   HCT 37.8 04/06/2023   MCV 73 (L) 04/06/2023   PLT 505 (H) 04/06/2023   Lab Results  Component Value Date   HGBA1C 8.8 (A) 04/13/2023   Lab Results  Component Value Date   CHOL 134 04/13/2023   HDL 38 (L) 04/13/2023   LDLCALC 85 04/13/2023   TRIG 51 04/13/2023   CHOLHDL 3.5 04/13/2023   Lab Results  Component Value Date   TSH 1.320 04/06/2023     Assessment and Plan:  1. Type 2 diabetes mellitus with other specified complication, without long-term current use of insulin (HCC) (Primary) Stop metformin, try glimepiride low-dose once daily.  Continue lifestyle changes.  If unable to tolerate glimepiride, will hold  off on pharmacotherapy and reassess A1c at next visit.  - glimepiride (AMARYL) 1 MG tablet; Take 1 tablet (1 mg total) by mouth daily with breakfast.  Dispense: 30 tablet; Refill: 1  2.  Iron deficiency anemia due to chronic blood loss Continue iron glycinate, which she seems to be tolerating well.  Plan to recheck iron labs next time.  3. Elevated blood pressure reading in office without diagnosis of hypertension Borderline hypertensive today, but normotensive elsewhere.  No pharmacotherapy at this time.  Follow clinically.     Return in about 2 months (around 07/12/2023) for OV f/u DM2, FeDef.    Alvester Morin, PA-C, DMSc, Nutritionist [facility] Primary Care and Sports Medicine Med[facility] [phone]

## 2023-05-14 IMAGING — MG MM DIGITAL SCREENING BILAT W/ TOMO AND CAD
6 of 10 series · 6 of 30 positions shown · non-contrast
Comparison: Previous exam(s).

CLINICAL DATA: Screening.

EXAM:
DIGITAL SCREENING BILATERAL MAMMOGRAM WITH TOMOSYNTHESIS AND CAD
TECHNIQUE: Bilateral screening digital craniocaudal and mediolateral oblique
mammograms were obtained. Bilateral screening digital breast
tomosynthesis was performed. The images were evaluated with
computer-aided detection.

[R MLO synth-2D]
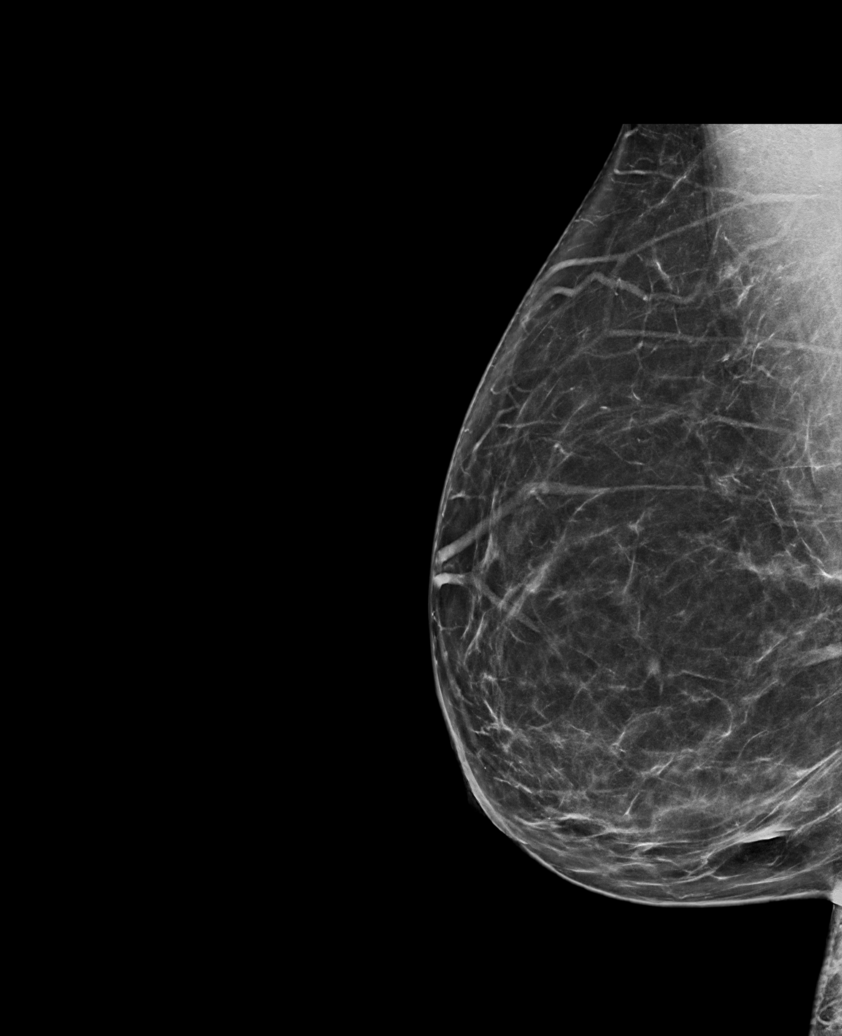

[R CC synth-2D (1 of 2)]
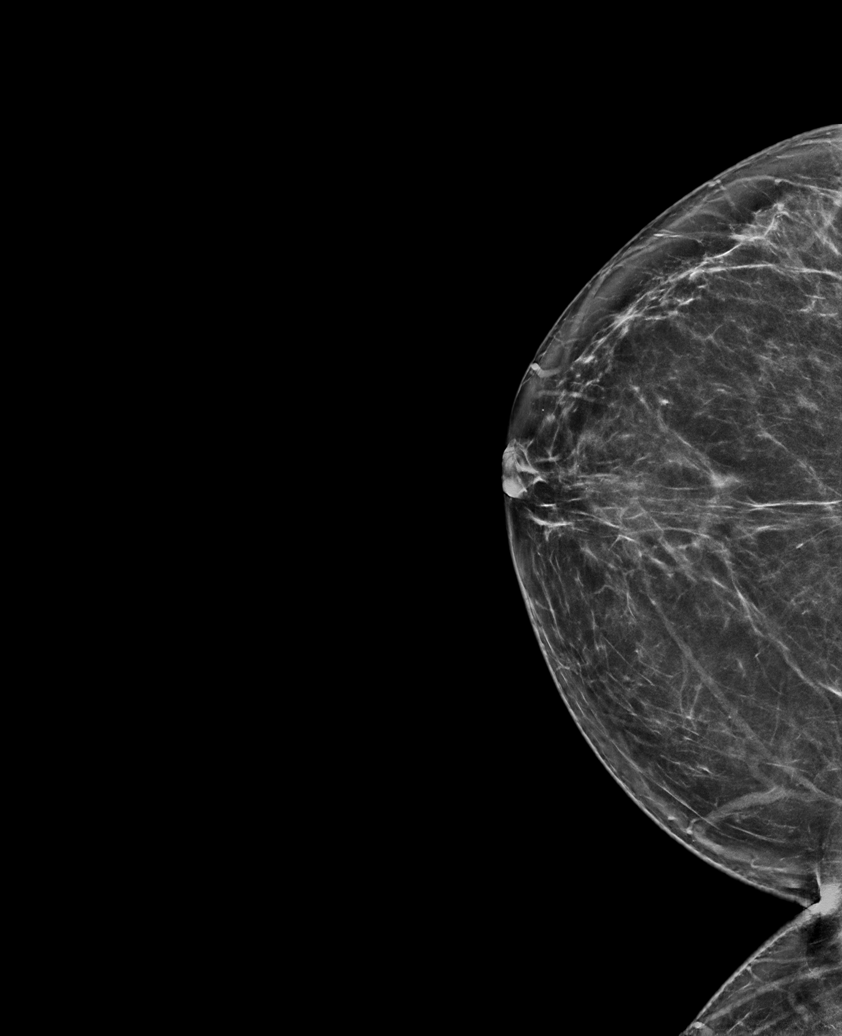

[R CC synth-2D (2 of 2)]
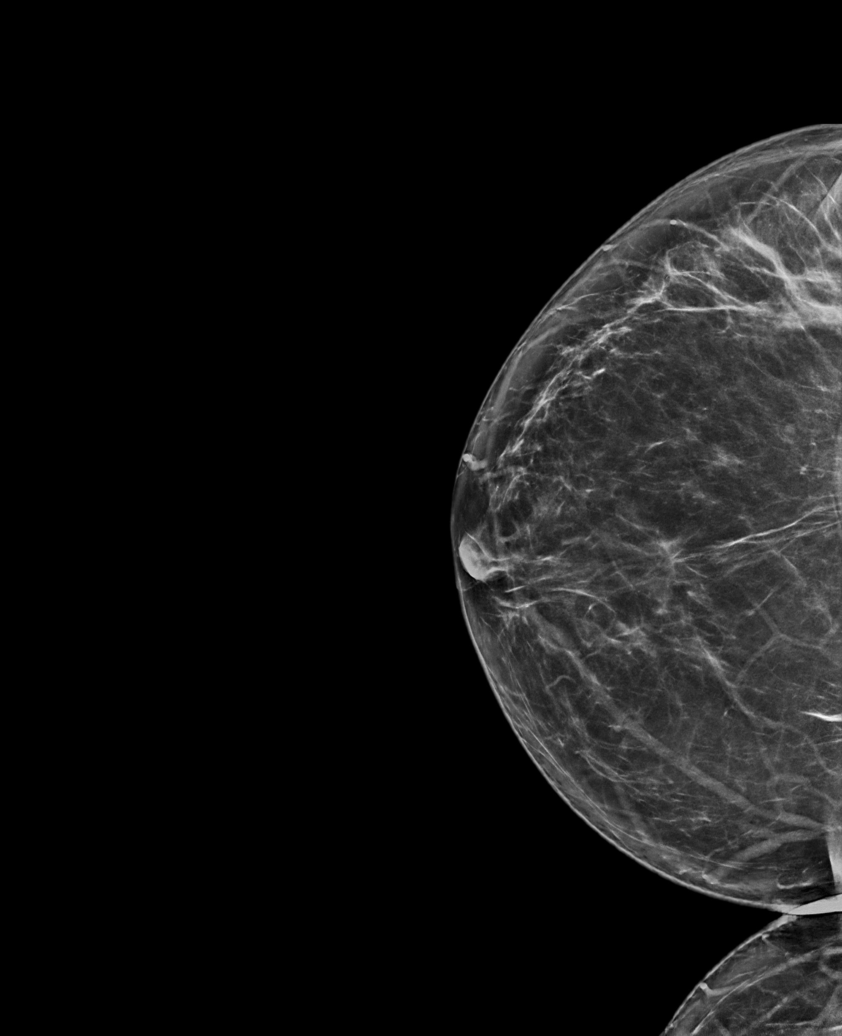

[L CC synth-2D]
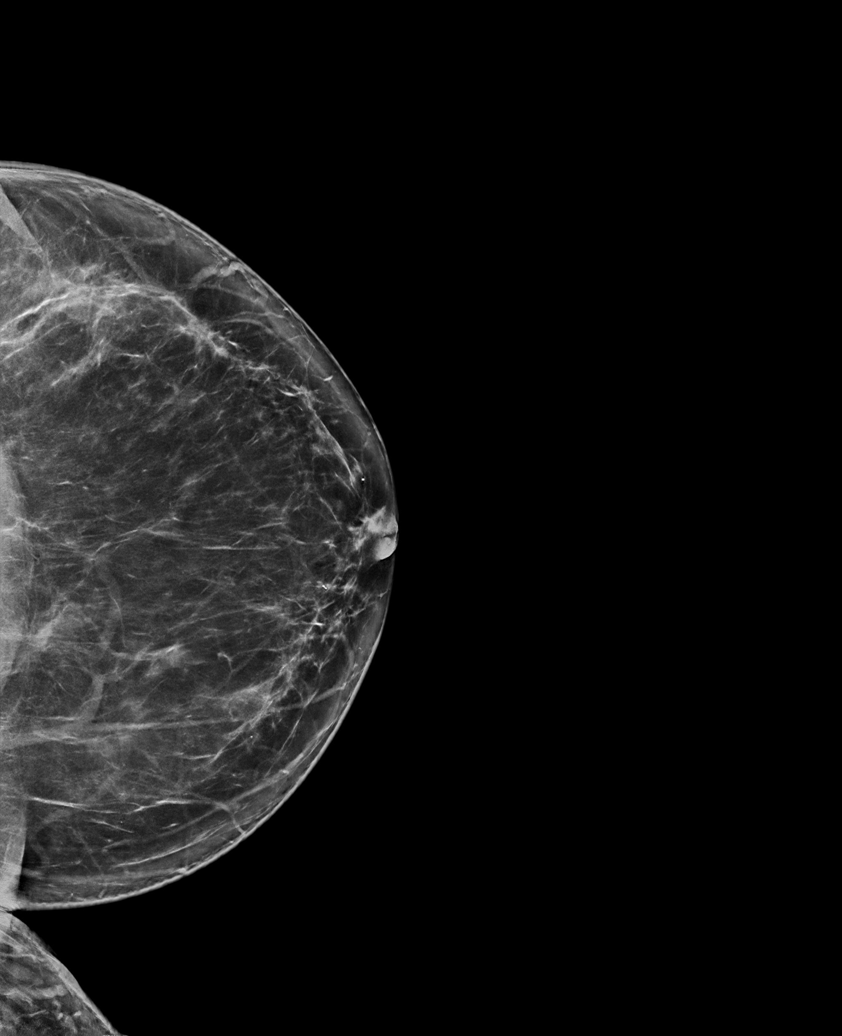

[L MLO synth-2D]
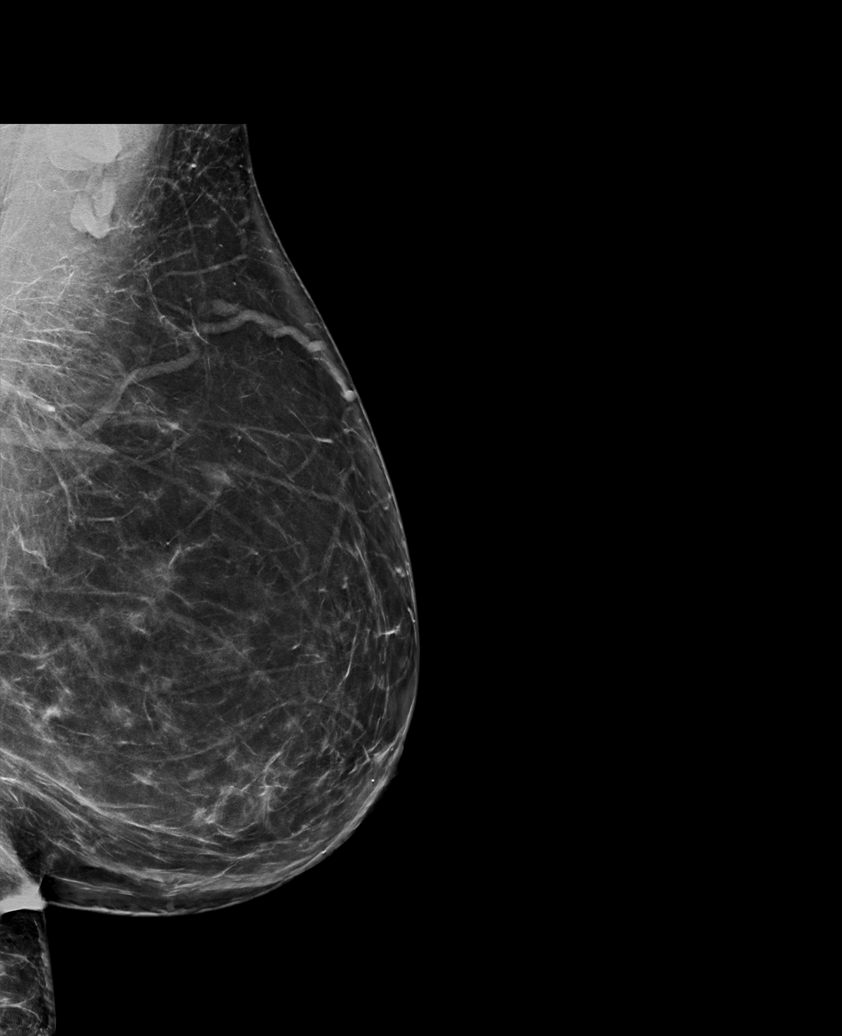

[R MLO tomo · tomo slice 41/80.0]
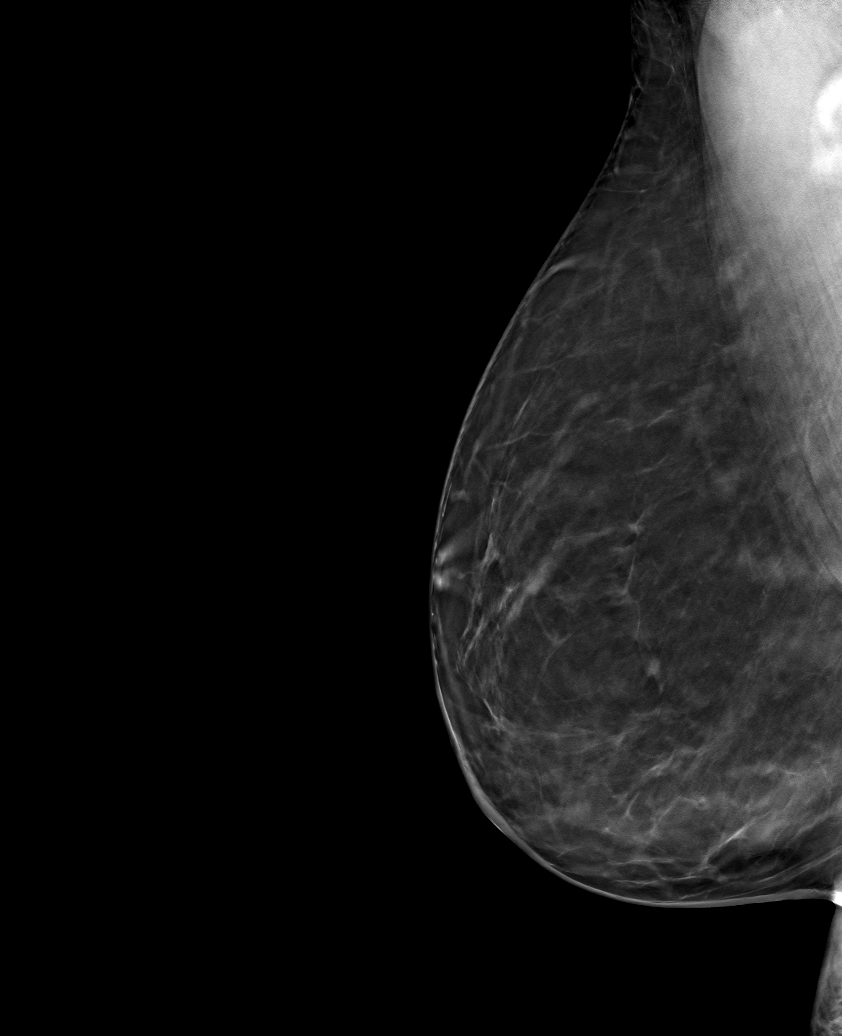

[6 of 30 positions shown; findings below may reference images not displayed]

ACR Breast Density Category b: There are scattered areas of
fibroglandular density.
FINDINGS: There are no findings suspicious for malignancy.
IMPRESSION: No mammographic evidence of malignancy. A result letter of this
screening mammogram will be mailed directly to the patient.

RECOMMENDATION:
Screening mammogram in one year. (Code:51-O-LD2)

BI-RADS CATEGORY  1: Negative.

## 2023-05-24 ENCOUNTER — Telehealth: Payer: Self-pay | Admitting: *Deleted

## 2023-06-02 DIAGNOSIS — H524 Presbyopia: Secondary | ICD-10-CM | POA: Diagnosis not present

## 2023-06-28 ENCOUNTER — Ambulatory Visit: Admitting: Physician Assistant

## 2023-07-13 ENCOUNTER — Ambulatory Visit: Admitting: Physician Assistant

## 2023-07-13 ENCOUNTER — Encounter: Payer: Self-pay | Admitting: Physician Assistant

## 2023-07-13 VITALS — BP 132/80 | HR 77 | Temp 98.0°F | Ht 62.0 in | Wt 151.0 lb

## 2023-07-13 DIAGNOSIS — E119 Type 2 diabetes mellitus without complications: Secondary | ICD-10-CM | POA: Diagnosis not present

## 2023-07-13 DIAGNOSIS — D500 Iron deficiency anemia secondary to blood loss (chronic): Secondary | ICD-10-CM | POA: Diagnosis not present

## 2023-07-13 DIAGNOSIS — M65312 Trigger thumb, left thumb: Secondary | ICD-10-CM | POA: Insufficient documentation

## 2023-07-13 LAB — POCT GLYCOSYLATED HEMOGLOBIN (HGB A1C): Hemoglobin A1C: 6.1 % — AB (ref 4.0–5.6)

## 2023-07-13 MED ORDER — CELECOXIB 200 MG PO CAPS
200.0000 mg | ORAL_CAPSULE | Freq: Every day | ORAL | 1 refills | Status: AC | PRN
Start: 2023-07-13 — End: ?

## 2023-07-13 NOTE — Assessment & Plan Note (Signed)
 Patient would like to try prescription strength oral NSAID since topical NSAID is not very effective.  Will prescribe Celebrex and refer to hand specialist for further evaluation.

## 2023-07-13 NOTE — Assessment & Plan Note (Signed)
 Repeat iron  labs.  Continue iron  glycinate for now.

## 2023-07-13 NOTE — Progress Notes (Signed)
 Date:  07/13/2023   Name:  Deandre Stansel   DOB:  Oct 22, 1975   MRN:  [id number]   Chief Complaint: Diabetes (Has not taking glimepiride  for 2 months, always hungry and eating more, [facility] for eye exam / ) and Trigger Finger (Left hand, thumb not getting better )  HPI Jolinda presents today for 10-week follow-up on chronic conditions, especially type II DM due for A1c today, most recently 8.8% as of 04/13/2023.  Last visit she reported metformin  intolerance, so was started on glimepiride  instead, took this medication for about 1 month but then stopped due to increased appetite.  She has made dramatic changes to both diet and physical activity, now incorporating some strength training as well per my recommendation last visit.  She is feeling much better about her health overall and is eager to see the A1c this time.  Since our visit in February, she is down about 7 pounds.  We are also following up on iron  deficiency, for which she has been using iron  glycinate with good compliance and tolerance.  She reports left trigger thumb which is beginning to interfere with her daily activities, states that is painful especially when it "locks" in a flexed position.  Voltaren gel does not seem to be helping.  She is not currently using any splint or compression garment.  Would like to see a specialist for consult on this.  She has not tried any OTC NSAIDs yet.   Medication list has been reviewed and updated.  Current Meds  Medication Sig   celecoxib (CELEBREX) 200 MG capsule Take 1 capsule (200 mg total) by mouth daily as needed (for joint pain). Take with food.  Do not take with other NSAIDs (ibuprofen, naproxen , aspirin, etc.)   IRON  GLYCINATE PO Take by mouth.   megestrol  (MEGACE ) 40 MG tablet Take 1 tablet (40 mg total) by mouth daily.   [DISCONTINUED] Ferrous Sulfate (IRON  PO) Take by mouth.     Review of Systems  Patient Active Problem List   Diagnosis Date Noted   Trigger  finger of left thumb 07/13/2023   Family history of rheumatoid arthritis 04/13/2023   Hypertension 04/13/2023   Diet-controlled diabetes mellitus (HCC) 04/09/2023   Thrombocytosis 04/09/2023   Arthralgia 04/08/2023   Fibroids 02/17/2022   Iron  deficiency anemia due to chronic blood loss 05/19/2021   EE (eosinophilic esophagitis) 11/05/2016   Esophageal stricture 09/17/2016    Allergies  Allergen Reactions   Metformin  And Related Diarrhea    Immunization History  Administered Date(s) Administered   Influenza,inj,Quad PF,6+ Mos 11/24/2016   PPD Test 11/24/2016    Past Surgical History:  Procedure Laterality Date   BREAST REDUCTION SURGERY Bilateral 2000   ESOPHAGEAL DILATION     X2 for stricture   IR RADIOLOGIST EVAL & MGMT  04/21/2022   LEEP  2004   REDUCTION MAMMAPLASTY     TUBAL LIGATION  2007    Social History   Tobacco Use   Smoking status: Never   Smokeless tobacco: Never  Vaping Use   Vaping status: Never Used  Substance Use Topics   Alcohol use: Not Currently   Drug use: Never    Family History  Problem Relation Age of Onset   Rheum arthritis Mother    Asthma Mother    Hypertension Father    Diabetes Father    Rheum arthritis Maternal Aunt         04/27/2023   10:07 AM 04/13/2023  9:36 AM  GAD 7 : Generalized Anxiety Score  Nervous, Anxious, on Edge 0 0  Control/stop worrying 1 1  Worry too much - different things 0 0  Trouble relaxing 0 0  Restless 0 0  Easily annoyed or irritable 0 0  Afraid - awful might happen 0 0  Total GAD 7 Score 1 1  Anxiety Difficulty Not difficult at all Not difficult at all       04/27/2023   10:07 AM 04/13/2023    9:36 AM 03/06/2022   10:01 AM  Depression screen PHQ 2/9  Decreased Interest 0 0 0  Down, Depressed, Hopeless 0 0 0  PHQ - 2 Score 0 0 0    BP Readings from Last 3 Encounters:  07/13/23 132/80  04/27/23 130/76  04/13/23 138/78    Wt Readings from Last 3 Encounters:  07/13/23 151 lb (68.5  kg)  04/27/23 155 lb (70.3 kg)  04/13/23 158 lb (71.7 kg)    BP 132/80   Pulse 77   Temp 98 F (36.7 C)   Ht 5\' 2"  (1.575 m)   Wt 151 lb (68.5 kg)   SpO2 97%   BMI 27.62 kg/m   Physical Exam Vitals and nursing note reviewed.  Constitutional:      Appearance: Normal appearance.  Cardiovascular:     Rate and Rhythm: Normal rate.  Pulmonary:     Effort: Pulmonary effort is normal.  Abdominal:     General: There is no distension.  Musculoskeletal:        General: Normal range of motion.     Comments: Normal exam of the left thumb with good range of motion, no deformity, nontender to palpation.  Skin:    General: Skin is warm and dry.  Neurological:     Mental Status: She is alert and oriented to person, place, and time.     Gait: Gait is intact.  Psychiatric:        Mood and Affect: Mood and affect normal.     Recent Labs     Component Value Date/Time   NA 138 04/06/2023 1602   K 4.0 04/06/2023 1602   CL 103 04/06/2023 1602   CO2 17 (L) 04/06/2023 1602   GLUCOSE 104 (H) 04/06/2023 1602   GLUCOSE 115 (H) 02/26/2022 1820   BUN 14 04/06/2023 1602   CREATININE 0.71 04/06/2023 1602   CALCIUM 10.0 04/06/2023 1602   PROT 7.4 04/06/2023 1602   ALBUMIN 4.7 04/06/2023 1602   AST 25 04/06/2023 1602   ALT 27 04/06/2023 1602   ALKPHOS 120 04/06/2023 1602   BILITOT 0.4 04/06/2023 1602   GFRNONAA >60 02/26/2022 1820    Lab Results  Component Value Date   WBC 6.6 04/06/2023   HGB 11.9 04/06/2023   HCT 37.8 04/06/2023   MCV 73 (L) 04/06/2023   PLT 505 (H) 04/06/2023   Lab Results  Component Value Date   HGBA1C 6.1 (A) 07/13/2023   Lab Results  Component Value Date   CHOL 134 04/13/2023   HDL 38 (L) 04/13/2023   LDLCALC 85 04/13/2023   TRIG 51 04/13/2023   CHOLHDL 3.5 04/13/2023   Lab Results  Component Value Date   TSH 1.320 04/06/2023   Lab Results  Component Value Date   HGBA1C 6.1 (A) 07/13/2023   HGBA1C 8.8 (A) 04/13/2023   HGBA1C 9.7 (H)  04/06/2023     Assessment and Plan:  Diet-controlled diabetes mellitus (HCC) Assessment & Plan: Patient congratulated on A1c  6.1% today reflecting dramatic improvement over the last 3 months.  Okay to continue with lifestyle management alone, no pharmacotherapy for now.  Repeat some routine labs today  Orders: -     POCT glycosylated hemoglobin (Hb A1C) -     CBC with Differential/Platelet -     Basic metabolic panel with GFR  Iron  deficiency anemia due to chronic blood loss Assessment & Plan: Repeat iron  labs.  Continue iron  glycinate for now.  Orders: -     CBC with Differential/Platelet -     Basic metabolic panel with GFR -     Iron , TIBC and Ferritin Panel  Trigger finger of left thumb Assessment & Plan: Patient would like to try prescription strength oral NSAID since topical NSAID is not very effective.  Will prescribe Celebrex and refer to hand specialist for further evaluation.  Orders: -     Celecoxib; Take 1 capsule (200 mg total) by mouth daily as needed (for joint pain). Take with food.  Do not take with other NSAIDs (ibuprofen, naproxen , aspirin, etc.)  Dispense: 30 capsule; Refill: 1 -     Ambulatory referral to Hand Surgery     Return in about 4 months (around 11/13/2023) for OV f/u chronic conditions.    Cody Das, PA-C, DMSc, Nutritionist [facility] Primary Care and Sports Medicine MedCenter [facility] [phone]

## 2023-07-13 NOTE — Assessment & Plan Note (Signed)
 Patient congratulated on A1c 6.1% today reflecting dramatic improvement over the last 3 months.  Okay to continue with lifestyle management alone, no pharmacotherapy for now.  Repeat some routine labs today

## 2023-07-14 ENCOUNTER — Other Ambulatory Visit: Payer: Self-pay | Admitting: Physician Assistant

## 2023-07-14 ENCOUNTER — Ambulatory Visit: Payer: Self-pay | Admitting: Physician Assistant

## 2023-07-14 DIAGNOSIS — D75839 Thrombocytosis, unspecified: Secondary | ICD-10-CM

## 2023-07-14 DIAGNOSIS — D500 Iron deficiency anemia secondary to blood loss (chronic): Secondary | ICD-10-CM

## 2023-07-14 LAB — CBC WITH DIFFERENTIAL/PLATELET
Basophils Absolute: 0 10*3/uL (ref 0.0–0.2)
Basos: 0 %
EOS (ABSOLUTE): 0.1 10*3/uL (ref 0.0–0.4)
Eos: 2 %
Hematocrit: 39.1 % (ref 34.0–46.6)
Hemoglobin: 11.5 g/dL (ref 11.1–15.9)
Immature Grans (Abs): 0 10*3/uL (ref 0.0–0.1)
Immature Granulocytes: 0 %
Lymphocytes Absolute: 1.7 10*3/uL (ref 0.7–3.1)
Lymphs: 28 %
MCH: 22.9 pg — ABNORMAL LOW (ref 26.6–33.0)
MCHC: 29.4 g/dL — ABNORMAL LOW (ref 31.5–35.7)
MCV: 78 fL — ABNORMAL LOW (ref 79–97)
Monocytes Absolute: 0.5 10*3/uL (ref 0.1–0.9)
Monocytes: 8 %
Neutrophils Absolute: 3.7 10*3/uL (ref 1.4–7.0)
Neutrophils: 62 %
Platelets: 514 10*3/uL — ABNORMAL HIGH (ref 150–450)
RBC: 5.02 x10E6/uL (ref 3.77–5.28)
RDW: 15.9 % — ABNORMAL HIGH (ref 11.7–15.4)
WBC: 6 10*3/uL (ref 3.4–10.8)

## 2023-07-14 LAB — BASIC METABOLIC PANEL WITH GFR
BUN/Creatinine Ratio: 10 (ref 9–23)
BUN: 6 mg/dL (ref 6–24)
CO2: 16 mmol/L — ABNORMAL LOW (ref 20–29)
Calcium: 9.6 mg/dL (ref 8.7–10.2)
Chloride: 106 mmol/L (ref 96–106)
Creatinine, Ser: 0.58 mg/dL (ref 0.57–1.00)
Glucose: 143 mg/dL — ABNORMAL HIGH (ref 70–99)
Potassium: 4.1 mmol/L (ref 3.5–5.2)
Sodium: 141 mmol/L (ref 134–144)
eGFR: 111 mL/min/{1.73_m2} (ref >59)

## 2023-07-14 LAB — IRON,TIBC AND FERRITIN PANEL
Ferritin: 5 ng/mL — ABNORMAL LOW (ref 15–150)
Iron Saturation: 4 % — CL (ref 15–55)
Iron: 21 ug/dL — ABNORMAL LOW (ref 27–159)
Total Iron Binding Capacity: 484 ug/dL — ABNORMAL HIGH (ref 250–450)
UIBC: 463 ug/dL — ABNORMAL HIGH (ref 131–425)

## 2023-07-26 ENCOUNTER — Encounter: Payer: Self-pay | Admitting: Oncology

## 2023-07-26 ENCOUNTER — Inpatient Hospital Stay

## 2023-07-26 ENCOUNTER — Inpatient Hospital Stay: Attending: Oncology | Admitting: Oncology

## 2023-07-26 VITALS — BP 128/92 | HR 82 | Temp 97.4°F | Resp 16 | Ht 62.0 in | Wt 153.1 lb

## 2023-07-26 DIAGNOSIS — D509 Iron deficiency anemia, unspecified: Secondary | ICD-10-CM | POA: Diagnosis not present

## 2023-07-26 DIAGNOSIS — D75839 Thrombocytosis, unspecified: Secondary | ICD-10-CM | POA: Insufficient documentation

## 2023-07-26 DIAGNOSIS — D500 Iron deficiency anemia secondary to blood loss (chronic): Secondary | ICD-10-CM

## 2023-07-27 ENCOUNTER — Encounter: Payer: Self-pay | Admitting: Oncology

## 2023-07-27 NOTE — Progress Notes (Signed)
 [facility]  [phone] Fax:([phone]) [phone]  ID: Kellie Cowan OB: 1975-12-15  MR#: [id number]  AOZ#:[id number]  Patient Care Team: Leopoldo Rancher, PA as PCP - General (Physician Assistant) Shellie Dials, MD as Consulting Physician (Oncology)  CHIEF COMPLAINT: Iron  deficiency anemia.  INTERVAL HISTORY: Patient is a 48 year old female who was noted to have a persistently decreased hemoglobin and iron  stores.  She is referred for further evaluation and consideration of IV treatment.  She currently feels well and is asymptomatic.  She does not complain of any weakness or fatigue. She has no neurologic complaints. She has a good appetite and denies weight loss.  He denies any chest pain, shortness breath, cough, or hemoptysis.  She denies any nausea, vomiting, constipation, or diarrhea.  She has a no melena or hematochezia.  She has no urinary complaints.  Patient offers no specific complaints today.  REVIEW OF SYSTEMS:   Review of Systems  Constitutional: Negative.  Negative for fever, malaise/fatigue and weight loss.  Respiratory: Negative.  Negative for cough, hemoptysis and shortness of breath.   Cardiovascular: Negative.  Negative for chest pain and leg swelling.  Gastrointestinal: Negative.  Negative for abdominal pain, blood in stool and melena.  Genitourinary: Negative.  Negative for dysuria.  Musculoskeletal: Negative.  Negative for back pain.  Skin: Negative.  Negative for rash.  Neurological: Negative.  Negative for dizziness, focal weakness, weakness and headaches.  Psychiatric/Behavioral: Negative.  The patient is not nervous/anxious.     As per HPI. Otherwise, a complete review of systems is negative.  PAST MEDICAL HISTORY: Past Medical History:  Diagnosis Date   Abnormal uterine bleeding (AUB) 02/17/2022   Arthritis    Asthma    no issues since youth   Cervical dysplasia    s/p LEEP in 2004   Diabetes mellitus without  complication [facility]) Feb 2025   T2   Esophageal stricture    s/p dilation x 2   GERD (gastroesophageal reflux disease) 2018   Hypertension    recently diagnosed   Iron  deficiency anemia due to chronic blood loss 05/19/2021    PAST SURGICAL HISTORY: Past Surgical History:  Procedure Laterality Date   BREAST REDUCTION SURGERY Bilateral 2000   ESOPHAGEAL DILATION     X2 for stricture   IR RADIOLOGIST EVAL & MGMT  04/21/2022   LEEP  2004   REDUCTION MAMMAPLASTY     TUBAL LIGATION  2007    FAMILY HISTORY: Family History  Problem Relation Age of Onset   Rheum arthritis Mother    Asthma Mother    Arthritis Mother    Hypertension Father    Diabetes Father    Rheum arthritis Maternal Aunt     ADVANCED DIRECTIVES (Y/N):  N  HEALTH MAINTENANCE: Social History   Tobacco Use   Smoking status: Never   Smokeless tobacco: Never  Vaping Use   Vaping status: Never Used  Substance Use Topics   Alcohol use: Not Currently   Drug use: Never     Colonoscopy:  PAP:  Bone density:  Lipid panel:  Allergies  Allergen Reactions   Metformin  And Related Diarrhea    Current Outpatient Medications  Medication Sig Dispense Refill   IRON  GLYCINATE PO Take by mouth.     megestrol  (MEGACE ) 40 MG tablet Take 1 tablet (40 mg total) by mouth daily. 60 tablet 5   celecoxib  (CELEBREX ) 200 MG capsule Take 1 capsule (200 mg total) by mouth daily as needed (for joint pain). Take  with food.  Do not take with other NSAIDs (ibuprofen, naproxen , aspirin, etc.) (Patient not taking: Reported on 07/26/2023) 30 capsule 1   No current facility-administered medications for this visit.    OBJECTIVE: Vitals:   07/26/23 1339  BP: (!) 128/92  Pulse: 82  Resp: 16  Temp: (!) 97.4 F (36.3 C)  SpO2: 100%     Body mass index is 28 kg/m.    ECOG FS:0 - Asymptomatic  General: Well-developed, well-nourished, no acute distress. Eyes: Pink conjunctiva, anicteric sclera. HEENT: Normocephalic, moist mucous  membranes. Lungs: No audible wheezing or coughing. Heart: Regular rate and rhythm. Abdomen: Soft, nontender, no obvious distention. Musculoskeletal: No edema, cyanosis, or clubbing. Neuro: Alert, answering all questions appropriately. Cranial nerves grossly intact. Skin: No rashes or petechiae noted. Psych: Normal affect. Lymphatics: No cervical, calvicular, axillary or inguinal LAD.   LAB RESULTS:  Lab Results  Component Value Date   NA 141 07/13/2023   K 4.1 07/13/2023   CL 106 07/13/2023   CO2 16 (L) 07/13/2023   GLUCOSE 143 (H) 07/13/2023   BUN 6 07/13/2023   CREATININE 0.58 07/13/2023   CALCIUM 9.6 07/13/2023   PROT 7.4 04/06/2023   ALBUMIN 4.7 04/06/2023   AST 25 04/06/2023   ALT 27 04/06/2023   ALKPHOS 120 04/06/2023   BILITOT 0.4 04/06/2023   GFRNONAA >60 02/26/2022    Lab Results  Component Value Date   WBC 6.0 07/13/2023   NEUTROABS 3.7 07/13/2023   HGB 11.5 07/13/2023   HCT 39.1 07/13/2023   MCV 78 (L) 07/13/2023   PLT 514 (H) 07/13/2023   Lab Results  Component Value Date   IRON  21 (L) 07/13/2023   TIBC 484 (H) 07/13/2023   IRONPCTSAT 4 (LL) 07/13/2023   Lab Results  Component Value Date   FERRITIN 5 (L) 07/13/2023     STUDIES: No results found.  ASSESSMENT: Iron  deficiency anemia.  PLAN:    Iron  deficiency anemia: Patient's hemoglobin is only mildly decreased at 11.5, but she has significantly reduced iron  stores.  Likely from heavy menses.  It does not appear patient has had a luminal evaluation.  Return to clinic 5 times in the next 1 to 2 weeks to receive 200 mg IV Venofer .  Patient will then return to clinic in 4 months with repeat laboratory work, further evaluation, and continuation of treatment if needed. Thrombocytosis: Likely reactive secondary to iron  deficiency.  I spent a total of 45 minutes reviewing chart data, face-to-face evaluation with the patient, counseling and coordination of care as detailed above.   Patient  expressed understanding and was in agreement with this plan. She also understands that She can call clinic at any time with any questions, concerns, or complaints.    Shellie Dials, MD   07/27/2023 8:57 AM

## 2023-08-02 ENCOUNTER — Inpatient Hospital Stay

## 2023-08-02 VITALS — BP 124/86 | HR 70 | Temp 97.0°F | Resp 16

## 2023-08-02 DIAGNOSIS — D500 Iron deficiency anemia secondary to blood loss (chronic): Secondary | ICD-10-CM

## 2023-08-02 DIAGNOSIS — D509 Iron deficiency anemia, unspecified: Secondary | ICD-10-CM | POA: Diagnosis not present

## 2023-08-02 DIAGNOSIS — D75839 Thrombocytosis, unspecified: Secondary | ICD-10-CM | POA: Diagnosis not present

## 2023-08-02 MED ORDER — IRON SUCROSE 20 MG/ML IV SOLN
200.0000 mg | Freq: Once | INTRAVENOUS | Status: AC
  Administered 2023-08-02: 200 mg via INTRAVENOUS
  Filled 2023-08-02: qty 10

## 2023-08-02 NOTE — Patient Instructions (Signed)

## 2023-08-05 ENCOUNTER — Inpatient Hospital Stay

## 2023-08-05 VITALS — BP 123/82 | HR 70 | Resp 16

## 2023-08-05 DIAGNOSIS — D500 Iron deficiency anemia secondary to blood loss (chronic): Secondary | ICD-10-CM

## 2023-08-05 DIAGNOSIS — D75839 Thrombocytosis, unspecified: Secondary | ICD-10-CM | POA: Diagnosis not present

## 2023-08-05 DIAGNOSIS — D509 Iron deficiency anemia, unspecified: Secondary | ICD-10-CM | POA: Diagnosis not present

## 2023-08-05 MED ORDER — IRON SUCROSE 20 MG/ML IV SOLN
200.0000 mg | Freq: Once | INTRAVENOUS | Status: AC
  Administered 2023-08-05: 200 mg via INTRAVENOUS
  Filled 2023-08-05: qty 10

## 2023-08-09 ENCOUNTER — Inpatient Hospital Stay

## 2023-08-09 VITALS — BP 136/87 | HR 72 | Temp 97.3°F | Resp 16

## 2023-08-09 DIAGNOSIS — D500 Iron deficiency anemia secondary to blood loss (chronic): Secondary | ICD-10-CM

## 2023-08-09 DIAGNOSIS — D509 Iron deficiency anemia, unspecified: Secondary | ICD-10-CM | POA: Diagnosis not present

## 2023-08-09 DIAGNOSIS — D75839 Thrombocytosis, unspecified: Secondary | ICD-10-CM | POA: Diagnosis not present

## 2023-08-09 MED ORDER — IRON SUCROSE 20 MG/ML IV SOLN
200.0000 mg | Freq: Once | INTRAVENOUS | Status: AC
  Administered 2023-08-09: 200 mg via INTRAVENOUS
  Filled 2023-08-09: qty 10

## 2023-08-12 ENCOUNTER — Inpatient Hospital Stay

## 2023-08-12 DIAGNOSIS — S60011A Contusion of right thumb without damage to nail, initial encounter: Secondary | ICD-10-CM | POA: Diagnosis not present

## 2023-08-12 DIAGNOSIS — T59811A Toxic effect of smoke, accidental (unintentional), initial encounter: Secondary | ICD-10-CM | POA: Diagnosis not present

## 2023-08-12 DIAGNOSIS — S66912A Strain of unspecified muscle, fascia and tendon at wrist and hand level, left hand, initial encounter: Secondary | ICD-10-CM | POA: Diagnosis not present

## 2023-08-13 ENCOUNTER — Inpatient Hospital Stay

## 2023-08-13 VITALS — BP 114/77 | HR 89 | Temp 97.8°F | Resp 18

## 2023-08-13 DIAGNOSIS — D500 Iron deficiency anemia secondary to blood loss (chronic): Secondary | ICD-10-CM

## 2023-08-13 DIAGNOSIS — D509 Iron deficiency anemia, unspecified: Secondary | ICD-10-CM | POA: Diagnosis not present

## 2023-08-13 DIAGNOSIS — D75839 Thrombocytosis, unspecified: Secondary | ICD-10-CM | POA: Diagnosis not present

## 2023-08-13 MED ORDER — IRON SUCROSE 20 MG/ML IV SOLN
200.0000 mg | Freq: Once | INTRAVENOUS | Status: AC
  Administered 2023-08-13: 200 mg via INTRAVENOUS

## 2023-08-16 ENCOUNTER — Inpatient Hospital Stay

## 2023-08-16 VITALS — BP 137/81 | HR 73 | Temp 97.0°F | Resp 16

## 2023-08-16 DIAGNOSIS — D509 Iron deficiency anemia, unspecified: Secondary | ICD-10-CM | POA: Diagnosis not present

## 2023-08-16 DIAGNOSIS — D500 Iron deficiency anemia secondary to blood loss (chronic): Secondary | ICD-10-CM

## 2023-08-16 DIAGNOSIS — D75839 Thrombocytosis, unspecified: Secondary | ICD-10-CM | POA: Diagnosis not present

## 2023-08-16 MED ORDER — IRON SUCROSE 20 MG/ML IV SOLN
200.0000 mg | Freq: Once | INTRAVENOUS | Status: AC
  Administered 2023-08-16: 200 mg via INTRAVENOUS

## 2023-08-16 MED ORDER — SODIUM CHLORIDE 0.9% FLUSH
10.0000 mL | Freq: Once | INTRAVENOUS | Status: AC | PRN
  Administered 2023-08-16: 10 mL
  Filled 2023-08-16: qty 10

## 2023-08-31 DIAGNOSIS — M65312 Trigger thumb, left thumb: Secondary | ICD-10-CM | POA: Diagnosis not present

## 2023-09-02 DIAGNOSIS — R350 Frequency of micturition: Secondary | ICD-10-CM | POA: Diagnosis not present

## 2023-09-02 DIAGNOSIS — N390 Urinary tract infection, site not specified: Secondary | ICD-10-CM | POA: Diagnosis not present

## 2023-10-05 DIAGNOSIS — M65312 Trigger thumb, left thumb: Secondary | ICD-10-CM | POA: Diagnosis not present

## 2023-10-05 DIAGNOSIS — M79645 Pain in left finger(s): Secondary | ICD-10-CM | POA: Diagnosis not present

## 2023-10-21 DIAGNOSIS — U071 COVID-19: Secondary | ICD-10-CM | POA: Diagnosis not present

## 2023-10-21 DIAGNOSIS — Z03818 Encounter for observation for suspected exposure to other biological agents ruled out: Secondary | ICD-10-CM | POA: Diagnosis not present

## 2023-10-21 DIAGNOSIS — J028 Acute pharyngitis due to other specified organisms: Secondary | ICD-10-CM | POA: Diagnosis not present

## 2023-10-21 DIAGNOSIS — J029 Acute pharyngitis, unspecified: Secondary | ICD-10-CM | POA: Diagnosis not present

## 2023-11-09 DIAGNOSIS — Z8616 Personal history of COVID-19: Secondary | ICD-10-CM | POA: Diagnosis not present

## 2023-11-09 DIAGNOSIS — R053 Chronic cough: Secondary | ICD-10-CM | POA: Diagnosis not present

## 2023-11-24 ENCOUNTER — Inpatient Hospital Stay: Attending: Oncology

## 2023-11-24 DIAGNOSIS — D509 Iron deficiency anemia, unspecified: Secondary | ICD-10-CM | POA: Diagnosis not present

## 2023-11-24 DIAGNOSIS — D500 Iron deficiency anemia secondary to blood loss (chronic): Secondary | ICD-10-CM

## 2023-11-24 LAB — CBC WITH DIFFERENTIAL/PLATELET
Abs Immature Granulocytes: 0.06 K/uL (ref 0.00–0.07)
Basophils Absolute: 0 K/uL (ref 0.0–0.1)
Basophils Relative: 0 %
Eosinophils Absolute: 0.1 K/uL (ref 0.0–0.5)
Eosinophils Relative: 2 %
HCT: 39.5 % (ref 36.0–46.0)
Hemoglobin: 13.7 g/dL (ref 12.0–15.0)
Immature Granulocytes: 1 %
Lymphocytes Relative: 33 %
Lymphs Abs: 1.8 K/uL (ref 0.7–4.0)
MCH: 29.9 pg (ref 26.0–34.0)
MCHC: 34.7 g/dL (ref 30.0–36.0)
MCV: 86.2 fL (ref 80.0–100.0)
Monocytes Absolute: 0.3 K/uL (ref 0.1–1.0)
Monocytes Relative: 6 %
Neutro Abs: 3.1 K/uL (ref 1.7–7.7)
Neutrophils Relative %: 58 %
Platelets: 285 K/uL (ref 150–400)
RBC: 4.58 MIL/uL (ref 3.87–5.11)
RDW: 13 % (ref 11.5–15.5)
WBC: 5.3 K/uL (ref 4.0–10.5)
nRBC: 0 % (ref 0.0–0.2)

## 2023-11-24 LAB — IRON AND TIBC
Iron: 102 ug/dL (ref 28–170)
Saturation Ratios: 28 % (ref 10.4–31.8)
TIBC: 360 ug/dL (ref 250–450)
UIBC: 258 ug/dL

## 2023-11-24 LAB — FERRITIN: Ferritin: 125 ng/mL (ref 11–307)

## 2023-11-25 ENCOUNTER — Inpatient Hospital Stay

## 2023-11-25 ENCOUNTER — Inpatient Hospital Stay: Admitting: Oncology

## 2024-02-28 ENCOUNTER — Other Ambulatory Visit: Payer: Self-pay | Admitting: *Deleted

## 2024-02-28 DIAGNOSIS — D500 Iron deficiency anemia secondary to blood loss (chronic): Secondary | ICD-10-CM

## 2024-02-29 ENCOUNTER — Inpatient Hospital Stay

## 2024-02-29 ENCOUNTER — Inpatient Hospital Stay: Attending: Oncology

## 2024-02-29 DIAGNOSIS — D509 Iron deficiency anemia, unspecified: Secondary | ICD-10-CM | POA: Insufficient documentation

## 2024-02-29 DIAGNOSIS — D500 Iron deficiency anemia secondary to blood loss (chronic): Secondary | ICD-10-CM

## 2024-02-29 LAB — CBC WITH DIFFERENTIAL/PLATELET
Abs Immature Granulocytes: 0.04 K/uL (ref 0.00–0.07)
Basophils Absolute: 0 K/uL (ref 0.0–0.1)
Basophils Relative: 1 %
Eosinophils Absolute: 0.1 K/uL (ref 0.0–0.5)
Eosinophils Relative: 2 %
HCT: 42.4 % (ref 36.0–46.0)
Hemoglobin: 14.5 g/dL (ref 12.0–15.0)
Immature Granulocytes: 1 %
Lymphocytes Relative: 29 %
Lymphs Abs: 1.8 K/uL (ref 0.7–4.0)
MCH: 29.7 pg (ref 26.0–34.0)
MCHC: 34.2 g/dL (ref 30.0–36.0)
MCV: 86.7 fL (ref 80.0–100.0)
Monocytes Absolute: 0.4 K/uL (ref 0.1–1.0)
Monocytes Relative: 7 %
Neutro Abs: 3.6 K/uL (ref 1.7–7.7)
Neutrophils Relative %: 60 %
Platelets: 347 K/uL (ref 150–400)
RBC: 4.89 MIL/uL (ref 3.87–5.11)
RDW: 12.1 % (ref 11.5–15.5)
WBC: 6 K/uL (ref 4.0–10.5)
nRBC: 0 % (ref 0.0–0.2)

## 2024-02-29 LAB — FERRITIN: Ferritin: 229 ng/mL (ref 11–307)

## 2024-02-29 LAB — IRON AND TIBC
Iron: 100 ug/dL (ref 28–170)
Saturation Ratios: 27 % (ref 10.4–31.8)
TIBC: 375 ug/dL (ref 250–450)
UIBC: 275 ug/dL

## 2024-03-01 ENCOUNTER — Inpatient Hospital Stay: Admitting: Oncology

## 2024-03-01 ENCOUNTER — Encounter: Payer: Self-pay | Admitting: Oncology

## 2024-03-01 ENCOUNTER — Inpatient Hospital Stay

## 2024-03-01 VITALS — BP 125/79 | HR 82 | Temp 96.5°F | Resp 18 | Wt 160.0 lb

## 2024-03-01 DIAGNOSIS — D500 Iron deficiency anemia secondary to blood loss (chronic): Secondary | ICD-10-CM | POA: Diagnosis not present

## 2024-03-01 DIAGNOSIS — D509 Iron deficiency anemia, unspecified: Secondary | ICD-10-CM | POA: Diagnosis not present

## 2024-03-01 NOTE — Progress Notes (Signed)
 " [facility]  Telephone:([phone] [phone]  ID: Kellie Cowan OB: May 29, 1975  MR#: [id number]  RDW#:[id number]  Patient Care Team: Manya Toribio SQUIBB, PA as PCP - General (Physician Assistant) Jacobo Evalene PARAS, MD as Consulting Physician (Oncology)  CHIEF COMPLAINT: Iron  deficiency anemia.  INTERVAL HISTORY: Patient returns to clinic today for repeat laboratory, further evaluation, and consideration of additional IV Venofer .  She continues to feel well and remains asymptomatic.  She does not complain of any weakness or fatigue. She has no neurologic complaints. She has a good appetite and denies weight loss.  He denies any chest pain, shortness breath, cough, or hemoptysis.  She denies any nausea, vomiting, constipation, or diarrhea.  She has a no melena or hematochezia.  She has no urinary complaints.  Patient offers no specific complaints today.    REVIEW OF SYSTEMS:   Review of Systems  Constitutional: Negative.  Negative for fever, malaise/fatigue and weight loss.  Respiratory: Negative.  Negative for cough, hemoptysis and shortness of breath.   Cardiovascular: Negative.  Negative for chest pain and leg swelling.  Gastrointestinal: Negative.  Negative for abdominal pain, blood in stool and melena.  Genitourinary: Negative.  Negative for dysuria.  Musculoskeletal: Negative.  Negative for back pain.  Skin: Negative.  Negative for rash.  Neurological: Negative.  Negative for dizziness, focal weakness, weakness and headaches.  Psychiatric/Behavioral: Negative.  The patient is not nervous/anxious.     As per HPI. Otherwise, a complete review of systems is negative.  PAST MEDICAL HISTORY: Past Medical History:  Diagnosis Date   Abnormal uterine bleeding (AUB) 02/17/2022   Arthritis    Asthma    no issues since youth   Cervical dysplasia    s/p LEEP in 2004   Diabetes mellitus without complication [facility]) Feb 2025   T2   Esophageal stricture     s/p dilation x 2   GERD (gastroesophageal reflux disease) 2018   Hypertension    recently diagnosed   Iron  deficiency anemia due to chronic blood loss 05/19/2021    PAST SURGICAL HISTORY: Past Surgical History:  Procedure Laterality Date   BREAST REDUCTION SURGERY Bilateral 2000   ESOPHAGEAL DILATION     X2 for stricture   IR RADIOLOGIST EVAL & MGMT  04/21/2022   LEEP  2004   REDUCTION MAMMAPLASTY     TUBAL LIGATION  2007    FAMILY HISTORY: Family History  Problem Relation Age of Onset   Rheum arthritis Mother    Asthma Mother    Arthritis Mother    Hypertension Father    Diabetes Father    Rheum arthritis Maternal Aunt     ADVANCED DIRECTIVES (Y/N):  N  HEALTH MAINTENANCE: Social History   Tobacco Use   Smoking status: Never   Smokeless tobacco: Never  Vaping Use   Vaping status: Never Used  Substance Use Topics   Alcohol use: Not Currently   Drug use: Never     Colonoscopy:  PAP:  Bone density:  Lipid panel:  Allergies  Allergen Reactions   Metformin  And Related Diarrhea    Current Outpatient Medications  Medication Sig Dispense Refill   megestrol  (MEGACE ) 40 MG tablet Take 1 tablet (40 mg total) by mouth daily. 60 tablet 5   celecoxib  (CELEBREX ) 200 MG capsule Take 1 capsule (200 mg total) by mouth daily as needed (for joint pain). Take with food.  Do not take with other NSAIDs (ibuprofen, naproxen , aspirin, etc.) (Patient not taking: Reported on 03/01/2024)  30 capsule 1   IRON  GLYCINATE PO Take by mouth. (Patient not taking: Reported on 03/01/2024)     No current facility-administered medications for this visit.    OBJECTIVE: Vitals:   03/01/24 1057  BP: 125/79  Pulse: 82  Resp: 18  Temp: (!) 96.5 F (35.8 C)  SpO2: 100%     Body mass index is 29.26 kg/m.    ECOG FS:0 - Asymptomatic  General: Well-developed, well-nourished, no acute distress. Eyes: Pink conjunctiva, anicteric sclera. HEENT: Normocephalic, moist mucous membranes. Lungs:  No audible wheezing or coughing. Heart: Regular rate and rhythm. Abdomen: Soft, nontender, no obvious distention. Musculoskeletal: No edema, cyanosis, or clubbing. Neuro: Alert, answering all questions appropriately. Cranial nerves grossly intact. Skin: No rashes or petechiae noted. Psych: Normal affect.  LAB RESULTS:  Lab Results  Component Value Date   NA 141 07/13/2023   K 4.1 07/13/2023   CL 106 07/13/2023   CO2 16 (L) 07/13/2023   GLUCOSE 143 (H) 07/13/2023   BUN 6 07/13/2023   CREATININE 0.58 07/13/2023   CALCIUM 9.6 07/13/2023   PROT 7.4 04/06/2023   ALBUMIN 4.7 04/06/2023   AST 25 04/06/2023   ALT 27 04/06/2023   ALKPHOS 120 04/06/2023   BILITOT 0.4 04/06/2023   GFRNONAA >60 02/26/2022    Lab Results  Component Value Date   WBC 6.0 02/29/2024   NEUTROABS 3.6 02/29/2024   HGB 14.5 02/29/2024   HCT 42.4 02/29/2024   MCV 86.7 02/29/2024   PLT 347 02/29/2024   Lab Results  Component Value Date   IRON  100 02/29/2024   TIBC 375 02/29/2024   IRONPCTSAT 27 02/29/2024   Lab Results  Component Value Date   FERRITIN 229 02/29/2024     STUDIES: No results found.  ASSESSMENT: Iron  deficiency anemia.  PLAN:    Iron  deficiency anemia: Most likely from heavy menses.  Although recent EGD in November 2025 revealed eosinophilic esophagitis which may be contributing.  She has not had a colonoscopy.  Her hemoglobin and iron  stores are now within normal limits.  She does not require additional IV Venofer  today.  Return to clinic in 6 months with repeat laboratory, further evaluation, and continuation of treatment if needed.  Thrombocytosis: Resolved. Luminal evaluation: Patient has been instructed to discuss screening colonoscopy with her gastroenterologist.  Patient expressed understanding and was in agreement with this plan. She also understands that She can call clinic at any time with any questions, concerns, or complaints.    Evalene JINNY Reusing, MD   03/02/2024  11:19 AM     "

## 2024-03-02 ENCOUNTER — Encounter: Payer: Self-pay | Admitting: Oncology

## 2024-03-17 ENCOUNTER — Encounter: Payer: Self-pay | Admitting: Obstetrics and Gynecology

## 2024-09-04 ENCOUNTER — Inpatient Hospital Stay

## 2024-09-05 ENCOUNTER — Inpatient Hospital Stay

## 2024-09-05 ENCOUNTER — Inpatient Hospital Stay: Admitting: Nurse Practitioner
